# Patient Record
Sex: Male | Born: 1965 | Race: White | Hispanic: No | Marital: Married | Smoking: Never smoker
Health system: Southern US, Community
[De-identification: ages and names within clinical notes are randomized; demographics above are authoritative.]

## PROBLEM LIST (undated history)

## (undated) DIAGNOSIS — K4090 Unilateral inguinal hernia, without obstruction or gangrene, not specified as recurrent: Secondary | ICD-10-CM

## (undated) DIAGNOSIS — R079 Chest pain, unspecified: Secondary | ICD-10-CM

## (undated) DIAGNOSIS — I10 Essential (primary) hypertension: Secondary | ICD-10-CM

## (undated) HISTORY — PX: HERNIA REPAIR: SHX51

## (undated) HISTORY — DX: Chest pain, unspecified: R07.9

## (undated) HISTORY — DX: Unilateral inguinal hernia, without obstruction or gangrene, not specified as recurrent: K40.90

---
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
## 2003-09-13 ENCOUNTER — Emergency Department (HOSPITAL_COMMUNITY): Admission: EM | Admit: 2003-09-13 | Discharge: 2003-09-13 | Payer: Self-pay | Admitting: Emergency Medicine

## 2012-07-12 ENCOUNTER — Encounter (INDEPENDENT_AMBULATORY_CARE_PROVIDER_SITE_OTHER): Payer: Self-pay | Admitting: Surgery

## 2012-07-15 ENCOUNTER — Ambulatory Visit (INDEPENDENT_AMBULATORY_CARE_PROVIDER_SITE_OTHER): Payer: BC Managed Care – PPO | Admitting: Surgery

## 2012-07-17 ENCOUNTER — Encounter (INDEPENDENT_AMBULATORY_CARE_PROVIDER_SITE_OTHER): Payer: Self-pay | Admitting: Surgery

## 2012-07-30 ENCOUNTER — Ambulatory Visit (INDEPENDENT_AMBULATORY_CARE_PROVIDER_SITE_OTHER): Payer: BC Managed Care – PPO | Admitting: Surgery

## 2012-08-02 ENCOUNTER — Ambulatory Visit (INDEPENDENT_AMBULATORY_CARE_PROVIDER_SITE_OTHER): Payer: BC Managed Care – PPO | Admitting: Surgery

## 2012-08-06 ENCOUNTER — Ambulatory Visit (INDEPENDENT_AMBULATORY_CARE_PROVIDER_SITE_OTHER): Payer: BC Managed Care – PPO | Admitting: Surgery

## 2012-08-06 ENCOUNTER — Encounter (INDEPENDENT_AMBULATORY_CARE_PROVIDER_SITE_OTHER): Payer: Self-pay | Admitting: Surgery

## 2012-08-06 VITALS — BP 144/88 | HR 71 | Temp 98.8°F | Resp 16 | Ht 67.0 in | Wt 192.8 lb

## 2012-08-06 DIAGNOSIS — K4090 Unilateral inguinal hernia, without obstruction or gangrene, not specified as recurrent: Secondary | ICD-10-CM | POA: Insufficient documentation

## 2012-08-06 NOTE — Progress Notes (Signed)
Patient ID: Brian Shields, male   DOB: 28-Mar-1966, 47 y.o.   MRN: [id number]  CC;  Left inguinal hernia HPI Brian Shields is a 47 y.o. male.  Referred by Dr. Minda Ditto for evaluation of left inguinal hernia  HPI This is a healthy 47 year old male who was working out about 2 months ago when he experienced a pulling sensation in his left groin. Subsequently this has become more uncomfortable and swollen. He denies any obstructive symptoms. Feels better when he lays down. The patient remains fairly active. He was evaluated by his primary care physician who diagnosed him with a reducible left inguinal hernia. He is now referred for surgical evaluation.  Past Medical History  Diagnosis Date  . Inguinal hernia     Past Surgical History  Procedure Laterality Date  . Pyloric stinosis  1977  . Tonsillectomy  1969    Family History  Problem Relation Age of Onset  . Hypertension Father     Social History History  Substance Use Topics  . Smoking status: Never Smoker   . Smokeless tobacco: Never Used  . Alcohol Use: Yes     Comment: occasionally    Allergies  Allergen Reactions  . Doxycycline     Seizures/fainted    No meds  Review of Systems Review of Systems  Constitutional: Negative for fever, chills and unexpected weight change.  HENT: Negative for hearing loss, congestion, sore throat, trouble swallowing and voice change.   Eyes: Negative for visual disturbance.  Respiratory: Negative for cough and wheezing.   Cardiovascular: Negative for chest pain, palpitations and leg swelling.  Gastrointestinal: Positive for abdominal distention. Negative for nausea, vomiting, abdominal pain, diarrhea, constipation, blood in stool, anal bleeding and rectal pain.  Genitourinary: Negative for hematuria and difficulty urinating.  Musculoskeletal: Negative for arthralgias.  Skin: Negative for rash and wound.  Neurological: Negative for seizures, syncope, weakness and headaches.   Hematological: Negative for adenopathy. Does not bruise/bleed easily.  Psychiatric/Behavioral: Negative for confusion.    Blood pressure 144/88, pulse 71, temperature 98.8 F (37.1 C), temperature source Temporal, resp. rate 16, height 5\' 7"  (1.702 m), weight 192 lb 12.8 oz (87.454 kg).  Physical Exam Physical Exam WDWN in NAD HEENT:  EOMI, sclera anicteric Neck:  No masses, no thyromegaly Lungs:  CTA bilaterally; normal respiratory effort CV:  Regular rate and rhythm; no murmurs Abd:  +bowel sounds, soft, non-tender, no masses GU bilateral descended testes no testicular masses, visible left inguinal bulge. This enlarges with Valsalva maneuver.  This is reducible. No sign of right inguinal hernia. Ext:  Well-perfused; no edema Skin:  Warm, dry; no sign of jaundice  Data Reviewed None  Assessment    Left inguinal hernia, reducible     Plan    Left inguinal hernia repair with mesh. We discussed options for treatment and I would recommend an open mesh repair.  The surgical procedure has been discussed with the patient.  Potential risks, benefits, alternative treatments, and expected outcomes have been explained.  All of the patient's questions at this time have been answered.  The likelihood of reaching the patient's treatment goal is good.  The patient understand the proposed surgical procedure and wishes to proceed.         TSUEI,MATTHEW K. 08/06/2012, 3:50 PM

## 2012-08-20 DIAGNOSIS — K4090 Unilateral inguinal hernia, without obstruction or gangrene, not specified as recurrent: Secondary | ICD-10-CM

## 2012-09-06 ENCOUNTER — Encounter (INDEPENDENT_AMBULATORY_CARE_PROVIDER_SITE_OTHER): Payer: Self-pay | Admitting: Surgery

## 2012-09-06 ENCOUNTER — Ambulatory Visit (INDEPENDENT_AMBULATORY_CARE_PROVIDER_SITE_OTHER): Payer: BC Managed Care – PPO | Admitting: Surgery

## 2012-09-06 VITALS — BP 128/80 | HR 74 | Temp 98.0°F | Resp 14 | Ht 67.0 in | Wt 188.6 lb

## 2012-09-06 DIAGNOSIS — K4090 Unilateral inguinal hernia, without obstruction or gangrene, not specified as recurrent: Secondary | ICD-10-CM

## 2012-09-06 NOTE — Progress Notes (Signed)
Status post open repair of a large left direct inguinal hernia on 08/20/12. The patient still has some residual soreness and skin sensitivity. He is unable to wear a belt on his pants. He has some tenderness in his left testicle. Appetite and bowel movements are normal.  His incision is well-healed with no sign of infection. Minimal swelling. No sign of recurrent hernia. Testicle is nontender.  Status post open repair of a left direct inguinal hernia with mesh. The patient may slowly begin increasing his level of activity over the next couple of weeks. The skin sensitivity and tenderness should resolve. Recommend use of ibuprofen. Followup as needed.  Wilmon Arms. Corliss Skains, MD, [facility]  09/06/2012 10:02 AM

## 2016-05-09 ENCOUNTER — Telehealth: Payer: Self-pay

## 2016-05-09 NOTE — Telephone Encounter (Signed)
SEND NOTES TO SCHEDULING

## 2016-05-11 ENCOUNTER — Telehealth: Payer: Self-pay | Admitting: *Deleted

## 2016-05-11 NOTE — Telephone Encounter (Signed)
NOTES SENT TO NL ON 05/11/16

## 2016-05-12 ENCOUNTER — Telehealth: Payer: Self-pay | Admitting: Cardiology

## 2016-05-12 NOTE — Telephone Encounter (Signed)
Received records from [employer] for appointment on 06/08/16 with Dr Percival Spanish.  Records given to [facility] (medical records) for Dr Hochrein's schedule on 06/08/16. lp

## 2016-06-07 NOTE — Progress Notes (Signed)
Cardiology Office Note   Date:  06/09/2016   ID:  Brian Shields, DOB 02-Oct-1965, MRN [id number]  PCP:  London Pepper, MD  Cardiologist:   Minus Breeding, MD  Referring:  London Pepper, MD  Chief Complaint  Patient presents with  . Chest Pain      History of Present Illness: Brian Shields is a 51 y.o. male who presents for evaluation of chest discomfort starting in early November. This was left upper chest discomfort with tingling in his fifth and fourth fingers on left hand. He has some tingling down his arm. He actually went to the emergency room at [facility].  He was thought to have nonanginal chest pain. His symptoms seem to improve lasting for about a week. Occasionally he'll still get tingling in his fingers. He's not having the chest discomfort anymore. He does walk about 5000 feet a day at work. He's not particularly active however. She's not describing substernal chest discomfort, neck or arm discomfort. The discomfort that he was having was mild and a tightness. There were no associated symptoms such as nausea vomiting or diaphoresis. He does not have shortness of breath, PND or orthopnea.  Past Medical History:  Diagnosis Date  . Chest pain   . Inguinal hernia     Past Surgical History:  Procedure Laterality Date  . HERNIA REPAIR Left   . pyloric stenosis  1977  . TONSILLECTOMY  1969     No current outpatient prescriptions on file.   No current facility-administered medications for this visit.     Allergies:   Doxycycline    Social History:  The patient  reports that he has never smoked. He has never used smokeless tobacco. He reports that he drinks alcohol. He reports that he does not use drugs.   Family History:  The patient's family history includes Hypertension in his father.    ROS:  Please see the history of present illness.   Otherwise, review of systems are positive for none.   All other systems are reviewed and negative.    PHYSICAL EXAM: VS:   BP (!) 140/92 (BP Location: Left Arm, Patient Position: Sitting, Cuff Size: Normal)   Pulse 92   Ht 5\' 7"  (1.702 m)   Wt 191 lb 6 oz (86.8 kg)   BMI 29.97 kg/m  , BMI Body mass index is 29.97 kg/m. GENERAL:  Well appearing HEENT:  Pupils equal round and reactive, fundi not visualized, oral mucosa unremarkable NECK:  No jugular venous distention, waveform within normal limits, carotid upstroke brisk and symmetric, no bruits, no thyromegaly LYMPHATICS:  No cervical, inguinal adenopathy LUNGS:  Clear to auscultation bilaterally BACK:  No CVA tenderness CHEST:  Unremarkable HEART:  PMI not displaced or sustained,S1 and S2 within normal limits, no S3, no S4, no clicks, no rubs, no murmurs ABD:  Flat, positive bowel sounds normal in frequency in pitch, no bruits, no rebound, no guarding, no midline pulsatile mass, no hepatomegaly, no splenomegaly EXT:  2 plus pulses throughout, no edema, no cyanosis no clubbing SKIN:  No rashes no nodules NEURO:  Cranial nerves II through XII grossly intact, motor grossly intact throughout PSYCH:  Cognitively intact, oriented to person place and time    EKG:  EKG is ordered today. The ekg ordered today demonstrates sinus rhythm, rate 93, axis within normal limits, intervals within normal limits, no acute T-wave changes.   Recent Labs: No results found for requested labs within last 8760 hours.    Lipid Panel  No results found for: CHOL, TRIG, HDL, CHOLHDL, VLDL, LDLCALC, LDLDIRECT    Wt Readings from Last 3 Encounters:  06/08/16 191 lb 6 oz (86.8 kg)  09/06/12 188 lb 9.6 oz (85.5 kg)  08/06/12 192 lb 12.8 oz (87.5 kg)      Other studies Reviewed: Additional studies/ records that were reviewed today include: Office records Dr. Orland Mustard. Review of the above records demonstrates:  Please see elsewhere in the note.     ASSESSMENT AND PLAN:  CHEST PAIN:  The chest pain is atypical. He's interested in risk stratification. Toward that end coronary  calcium would be helpful. I'll set him up to have this. We did talk about primary risk reduction. Further evaluation will be based on this result.  HTN: His blood pressure is elevated. He says it's been borderline. He has lost about 15 pounds recently. I told him to continue with therapeutic lifestyle changes but if his blood pressure remains elevated with increased exercise, continued mild weight loss, salt restriction that he would likely need an antihypertensive.    Current medicines are reviewed at length with the patient today.  The patient does not have concerns regarding medicines.  The following changes have been made:  no change  Labs/ tests ordered today include:   Orders Placed This Encounter  Procedures  . CT CARDIAC SCORING     Disposition:   FU with me as needed.      Signed, Minus Breeding, MD  06/09/2016 9:37 AM    [facility]

## 2016-06-08 ENCOUNTER — Encounter: Payer: Self-pay | Admitting: Cardiology

## 2016-06-08 ENCOUNTER — Ambulatory Visit (INDEPENDENT_AMBULATORY_CARE_PROVIDER_SITE_OTHER): Payer: 59 | Admitting: Cardiology

## 2016-06-08 VITALS — BP 140/92 | HR 92 | Ht 67.0 in | Wt 191.4 lb

## 2016-06-08 DIAGNOSIS — R079 Chest pain, unspecified: Secondary | ICD-10-CM

## 2016-06-08 NOTE — Patient Instructions (Signed)
Medication Instructions:  Continue current medications  Labwork: None Ordered  Testing/Procedures: Your physician has requested that you have a Coronary Calcium scoring Test. This test is done at our [address] office.  Follow-Up: Your physician recommends that you schedule a follow-up appointment in: As Needed   Any Other Special Instructions Will Be Listed Below (If Applicable).   If you need a refill on your cardiac medications before your next appointment, please call your pharmacy.

## 2016-06-09 ENCOUNTER — Encounter: Payer: Self-pay | Admitting: Cardiology

## 2016-06-13 NOTE — Addendum Note (Signed)
Addended by: Vennie Homans on: 06/13/2016 12:55 PM   Modules accepted: Orders

## 2016-06-15 ENCOUNTER — Ambulatory Visit (INDEPENDENT_AMBULATORY_CARE_PROVIDER_SITE_OTHER)
Admission: RE | Admit: 2016-06-15 | Discharge: 2016-06-15 | Disposition: A | Discharge status: Home / Self Care | Payer: Self-pay | Source: Ambulatory Visit | Attending: Cardiology | Admitting: Cardiology

## 2016-06-15 DIAGNOSIS — R079 Chest pain, unspecified: Secondary | ICD-10-CM

## 2016-06-16 ENCOUNTER — Telehealth: Payer: Self-pay | Admitting: Cardiology

## 2016-06-16 NOTE — Telephone Encounter (Signed)
Returned call to patient, made aware of results and recommendations:  Notes Recorded by Minus Breeding, MD on 06/15/2016 at 4:10 PM EST Coronary calcium score is zero. No change in therapy. Call Mr. Delangel with the results and send results to London Pepper, MD   Sent to PCP per Dr. Percival Spanish request-pt verbalized understanding.

## 2016-06-16 NOTE — Telephone Encounter (Signed)
New Message    Pt stated he was returning phone call he received from nurse.

## 2016-08-09 DIAGNOSIS — Z0000 Encounter for general adult medical examination without abnormal findings: Secondary | ICD-10-CM | POA: Diagnosis not present

## 2016-08-09 DIAGNOSIS — Z23 Encounter for immunization: Secondary | ICD-10-CM | POA: Diagnosis not present

## 2016-08-09 DIAGNOSIS — E781 Pure hyperglyceridemia: Secondary | ICD-10-CM | POA: Diagnosis not present

## 2016-08-09 DIAGNOSIS — R030 Elevated blood-pressure reading, without diagnosis of hypertension: Secondary | ICD-10-CM | POA: Diagnosis not present

## 2017-04-15 DIAGNOSIS — J028 Acute pharyngitis due to other specified organisms: Secondary | ICD-10-CM | POA: Diagnosis not present

## 2017-04-15 DIAGNOSIS — B338 Other specified viral diseases: Secondary | ICD-10-CM | POA: Diagnosis not present

## 2017-04-19 DIAGNOSIS — J069 Acute upper respiratory infection, unspecified: Secondary | ICD-10-CM | POA: Diagnosis not present

## 2017-06-20 DIAGNOSIS — I10 Essential (primary) hypertension: Secondary | ICD-10-CM | POA: Diagnosis not present

## 2017-07-13 DIAGNOSIS — I10 Essential (primary) hypertension: Secondary | ICD-10-CM | POA: Diagnosis not present

## 2017-08-13 DIAGNOSIS — Z0000 Encounter for general adult medical examination without abnormal findings: Secondary | ICD-10-CM | POA: Diagnosis not present

## 2017-08-13 DIAGNOSIS — Z125 Encounter for screening for malignant neoplasm of prostate: Secondary | ICD-10-CM | POA: Diagnosis not present

## 2017-08-13 DIAGNOSIS — I10 Essential (primary) hypertension: Secondary | ICD-10-CM | POA: Diagnosis not present

## 2017-08-13 DIAGNOSIS — Z13220 Encounter for screening for lipoid disorders: Secondary | ICD-10-CM | POA: Diagnosis not present

## 2017-08-13 DIAGNOSIS — Z8349 Family history of other endocrine, nutritional and metabolic diseases: Secondary | ICD-10-CM | POA: Diagnosis not present

## 2017-11-05 DIAGNOSIS — Z1211 Encounter for screening for malignant neoplasm of colon: Secondary | ICD-10-CM | POA: Diagnosis not present

## 2017-11-05 DIAGNOSIS — D126 Benign neoplasm of colon, unspecified: Secondary | ICD-10-CM | POA: Diagnosis not present

## 2017-11-21 DIAGNOSIS — J069 Acute upper respiratory infection, unspecified: Secondary | ICD-10-CM | POA: Diagnosis not present

## 2017-12-10 DIAGNOSIS — J3089 Other allergic rhinitis: Secondary | ICD-10-CM | POA: Diagnosis not present

## 2017-12-10 DIAGNOSIS — R05 Cough: Secondary | ICD-10-CM | POA: Diagnosis not present

## 2018-08-28 DIAGNOSIS — Z0000 Encounter for general adult medical examination without abnormal findings: Secondary | ICD-10-CM | POA: Diagnosis not present

## 2018-08-28 DIAGNOSIS — Z13220 Encounter for screening for lipoid disorders: Secondary | ICD-10-CM | POA: Diagnosis not present

## 2018-08-28 DIAGNOSIS — Z8349 Family history of other endocrine, nutritional and metabolic diseases: Secondary | ICD-10-CM | POA: Diagnosis not present

## 2018-08-28 DIAGNOSIS — Z125 Encounter for screening for malignant neoplasm of prostate: Secondary | ICD-10-CM | POA: Diagnosis not present

## 2018-11-24 ENCOUNTER — Emergency Department (HOSPITAL_BASED_OUTPATIENT_CLINIC_OR_DEPARTMENT_OTHER): Payer: 59

## 2018-11-24 ENCOUNTER — Other Ambulatory Visit: Payer: Self-pay

## 2018-11-24 ENCOUNTER — Encounter (HOSPITAL_BASED_OUTPATIENT_CLINIC_OR_DEPARTMENT_OTHER): Payer: Self-pay | Admitting: Emergency Medicine

## 2018-11-24 ENCOUNTER — Emergency Department (HOSPITAL_BASED_OUTPATIENT_CLINIC_OR_DEPARTMENT_OTHER)
Admission: EM | Admit: 2018-11-24 | Discharge: 2018-11-24 | Disposition: A | Discharge status: Home / Self Care | Payer: 59 | Attending: Emergency Medicine | Admitting: Emergency Medicine

## 2018-11-24 DIAGNOSIS — R030 Elevated blood-pressure reading, without diagnosis of hypertension: Secondary | ICD-10-CM

## 2018-11-24 DIAGNOSIS — K529 Noninfective gastroenteritis and colitis, unspecified: Secondary | ICD-10-CM | POA: Diagnosis not present

## 2018-11-24 DIAGNOSIS — Z23 Encounter for immunization: Secondary | ICD-10-CM | POA: Diagnosis not present

## 2018-11-24 DIAGNOSIS — Z79899 Other long term (current) drug therapy: Secondary | ICD-10-CM | POA: Diagnosis not present

## 2018-11-24 DIAGNOSIS — I10 Essential (primary) hypertension: Secondary | ICD-10-CM | POA: Diagnosis not present

## 2018-11-24 DIAGNOSIS — S0181XA Laceration without foreign body of other part of head, initial encounter: Secondary | ICD-10-CM

## 2018-11-24 DIAGNOSIS — K921 Melena: Secondary | ICD-10-CM

## 2018-11-24 DIAGNOSIS — Y92002 Bathroom of unspecified non-institutional (private) residence single-family (private) house as the place of occurrence of the external cause: Secondary | ICD-10-CM | POA: Diagnosis not present

## 2018-11-24 DIAGNOSIS — Y999 Unspecified external cause status: Secondary | ICD-10-CM | POA: Diagnosis not present

## 2018-11-24 DIAGNOSIS — S0993XA Unspecified injury of face, initial encounter: Secondary | ICD-10-CM | POA: Diagnosis present

## 2018-11-24 DIAGNOSIS — W1812XA Fall from or off toilet with subsequent striking against object, initial encounter: Secondary | ICD-10-CM | POA: Insufficient documentation

## 2018-11-24 DIAGNOSIS — R55 Syncope and collapse: Secondary | ICD-10-CM | POA: Diagnosis not present

## 2018-11-24 DIAGNOSIS — Y9389 Activity, other specified: Secondary | ICD-10-CM | POA: Insufficient documentation

## 2018-11-24 HISTORY — DX: Essential (primary) hypertension: I10

## 2018-11-24 LAB — CBC WITH DIFFERENTIAL/PLATELET
Abs Immature Granulocytes: 0.05 10*3/uL (ref 0.00–0.07)
Basophils Absolute: 0.1 10*3/uL (ref 0.0–0.1)
Basophils Relative: 1 %
Eosinophils Absolute: 0.2 10*3/uL (ref 0.0–0.5)
Eosinophils Relative: 2 %
HCT: 51.4 % (ref 39.0–52.0)
Hemoglobin: 17 g/dL (ref 13.0–17.0)
Immature Granulocytes: 0 %
Lymphocytes Relative: 16 %
Lymphs Abs: 2 10*3/uL (ref 0.7–4.0)
MCH: 29.6 pg (ref 26.0–34.0)
MCHC: 33.1 g/dL (ref 30.0–36.0)
MCV: 89.5 fL (ref 80.0–100.0)
Monocytes Absolute: 1.2 10*3/uL — ABNORMAL HIGH (ref 0.1–1.0)
Monocytes Relative: 10 %
Neutro Abs: 8.9 10*3/uL — ABNORMAL HIGH (ref 1.7–7.7)
Neutrophils Relative %: 71 %
Platelets: 257 10*3/uL (ref 150–400)
RBC: 5.74 MIL/uL (ref 4.22–5.81)
RDW: 11.9 % (ref 11.5–15.5)
WBC: 12.4 10*3/uL — ABNORMAL HIGH (ref 4.0–10.5)
nRBC: 0 % (ref 0.0–0.2)

## 2018-11-24 LAB — CBC
HCT: 48 % (ref 39.0–52.0)
Hemoglobin: 16 g/dL (ref 13.0–17.0)
MCH: 29.6 pg (ref 26.0–34.0)
MCHC: 33.3 g/dL (ref 30.0–36.0)
MCV: 88.7 fL (ref 80.0–100.0)
Platelets: 241 10*3/uL (ref 150–400)
RBC: 5.41 MIL/uL (ref 4.22–5.81)
RDW: 12.1 % (ref 11.5–15.5)
WBC: 14 10*3/uL — ABNORMAL HIGH (ref 4.0–10.5)
nRBC: 0 % (ref 0.0–0.2)

## 2018-11-24 LAB — COMPREHENSIVE METABOLIC PANEL
ALT: 31 U/L (ref 0–44)
AST: 25 U/L (ref 15–41)
Albumin: 4.5 g/dL (ref 3.5–5.0)
Alkaline Phosphatase: 45 U/L (ref 38–126)
Anion gap: 9 (ref 5–15)
BUN: 15 mg/dL (ref 6–20)
CO2: 26 mmol/L (ref 22–32)
Calcium: 10.3 mg/dL (ref 8.9–10.3)
Chloride: 103 mmol/L (ref 98–111)
Creatinine, Ser: 1.05 mg/dL (ref 0.61–1.24)
GFR calc Af Amer: >60 mL/min (ref >60)
GFR calc non Af Amer: >60 mL/min (ref >60)
Glucose, Bld: 121 mg/dL — ABNORMAL HIGH (ref 70–99)
Potassium: 4 mmol/L (ref 3.5–5.1)
Sodium: 138 mmol/L (ref 135–145)
Total Bilirubin: 1 mg/dL (ref 0.3–1.2)
Total Protein: 7.9 g/dL (ref 6.5–8.1)

## 2018-11-24 LAB — TROPONIN I: Troponin I: <0.03 ng/mL (ref <0.03)

## 2018-11-24 LAB — OCCULT BLOOD X 1 CARD TO LAB, STOOL: Fecal Occult Bld: POSITIVE — AB

## 2018-11-24 LAB — CBG MONITORING, ED: Glucose-Capillary: 107 mg/dL — ABNORMAL HIGH (ref 70–99)

## 2018-11-24 MED ORDER — DICYCLOMINE HCL 10 MG PO CAPS
20.0000 mg | ORAL_CAPSULE | Freq: Once | ORAL | Status: AC
  Administered 2018-11-24: 20 mg via ORAL
  Filled 2018-11-24: qty 2

## 2018-11-24 MED ORDER — IOHEXOL 300 MG/ML  SOLN
100.0000 mL | Freq: Once | INTRAMUSCULAR | Status: AC | PRN
  Administered 2018-11-24: 100 mL via INTRAVENOUS

## 2018-11-24 MED ORDER — TETANUS-DIPHTH-ACELL PERTUSSIS 5-2.5-18.5 LF-MCG/0.5 IM SUSP
0.5000 mL | Freq: Once | INTRAMUSCULAR | Status: AC
  Administered 2018-11-24: 0.5 mL via INTRAMUSCULAR
  Filled 2018-11-24: qty 0.5

## 2018-11-24 MED ORDER — SODIUM CHLORIDE 0.9 % IV BOLUS
1000.0000 mL | Freq: Once | INTRAVENOUS | Status: AC
  Administered 2018-11-24: 1000 mL via INTRAVENOUS

## 2018-11-24 MED ORDER — LIDOCAINE-EPINEPHRINE (PF) 2 %-1:200000 IJ SOLN
10.0000 mL | Freq: Once | INTRAMUSCULAR | Status: AC
  Administered 2018-11-24: 10 mL
  Filled 2018-11-24 (×2): qty 10

## 2018-11-24 MED ORDER — DICYCLOMINE HCL 20 MG PO TABS: 20.0000 mg | ORAL_TABLET | Freq: Two times a day (BID) | ORAL | 0 refills | Status: DC

## 2018-11-24 NOTE — ED Notes (Signed)
Patient transported to CT 

## 2018-11-24 NOTE — Discharge Instructions (Addendum)
Please read and follow all provided instructions.  Your diagnoses today include:  1. Enterocolitis   2. Syncope, unspecified syncope type   3. Blood in stool   4. Laceration of forehead, initial encounter     Tests performed today include: Vital signs. See below for your results today.   Your CT scan showed no fractures in the face, skull, or evidence of bleeding in the brain.  Please review the CT report I gave you.  It showed evidence of enterocolitis, unclear infectious versus inflammatory etiology.  Your hemoglobin (oxygen capacity in the blood) was stable on 2 evaluations today.  Medications prescribed:   Take any prescribed medications only as directed.   Please take Bentyl twice daily as needed for abdominal cramps. This is an antispasmodic agent.   Home care instructions:  Follow any educational materials and wound care instructions contained in this packet.   Keep affected area above the level of your heart when possible to minimize swelling. Wash area gently twice a day with warm soapy water. Do not apply alcohol or hydrogen peroxide. Cover the area if it draining or weeping.   Please ice the swelling over your left eyebrow diligently.  This will help to decrease the amount of swelling.  Please ensure to place a towel in between your ice and the skin.  Follow-up instructions: Suture Removal: Return to see your primary care care doctor in 5 days for a recheck of your wound and removal of your sutures or staples.    Return instructions:  Return to the Emergency Department if you have: Fever Worsening pain Worsening swelling of the wound Pus draining from the wound Redness of the skin that moves away from the wound, especially if it streaks away from the affected area  Any other emergent concerns  Return to the emergency department if you have any dizziness, lightheadedness, further passing out spells, particularly after passing any bright red blood per  rectum.  Your vital signs today were: BP (!) 151/101    Pulse 90    Temp 97.8 F (36.6 C) (Oral)    Resp 20    Ht 5\' 7"  (1.702 m)    Wt 88.5 kg    SpO2 100%    BMI 30.54 kg/m  If your blood pressure (BP) was elevated above 135/85 this visit, please have this repeated by your doctor within one month. --------------

## 2018-11-24 NOTE — ED Notes (Addendum)
Pt returned from CT scan. CT tech reports he vomited after receiving IV contrast. Pt states nausea is relieved at this time. Provider notified

## 2018-11-24 NOTE — ED Notes (Signed)
Pt ambulatory to d/c window with steady gait. Pt verbalized understanding of f/u care and that RX can be picked up at pharmacy listed on AVS. Pt walked to car without difficulty with stand by assist from ED staff

## 2018-11-24 NOTE — ED Notes (Signed)
ED Provider at bedside. 

## 2018-11-24 NOTE — ED Provider Notes (Signed)
[facility] Provider Note   CSN: [id number] Arrival date & time: 11/24/18  1639     History   Chief Complaint Chief Complaint  Patient presents with  . Loss of Consciousness    HPI Brian Shields is a 53 y.o. male.     HPI  Patient is a 53 year old male with past medical history of hypertension presenting for episode of syncope while on the toilet today.  Patient reports that he has had a diarrhea illness today and just prior to arrival he was having some loose stools when he found himself on the ground.  Patient reports that he was clammy at the time.  Patient did not know if he was straining or not.  He denies any prodromal chest pain or shortness of breath.  He denies any headache before the syncope.  Denies numbness or tingling. He developed a hematoma on the forehead as a result of the syncope.  He reports that he feels back to baseline now.  He does report that when he had another bowel movement after the episode of syncope he noticed bright red blood in the toilet.  He denies a history of this.  Patient's other episode of syncope 1 year ago occurred in the exact same fashion with abdominal cramping followed by diarrhea.  He was not medically evaluated time.  Patient had a cardiac evaluation 2 years ago after an episode of chest pain and he was cleared by his cardiologist after a unremarkable coronary artery CT and cardiovascular work-up.  Past Medical History:  Diagnosis Date  . Chest pain   . Hypertension   . Inguinal hernia     Patient Active Problem List   Diagnosis Date Noted  . Left inguinal hernia 08/06/2012    Past Surgical History:  Procedure Laterality Date  . HERNIA REPAIR Left   . pyloric stenosis  1977  . TONSILLECTOMY  1969        Home Medications    Prior to Admission medications   Medication Sig Start Date End Date Taking? Authorizing Provider  lisinopril (ZESTRIL) 10 MG tablet Take 10 mg by mouth daily.   Yes  [provider]    Family History Family History  Problem Relation Age of Onset  . Hypertension Father     Social History Social History   Tobacco Use  . Smoking status: Never Smoker  . Smokeless tobacco: Never Used  Substance Use Topics  . Alcohol use: Yes    Comment: occasionally  . Drug use: No     Allergies   Doxycycline   Review of Systems Review of Systems  Constitutional: Negative for chills and fever.  HENT: Negative for congestion, rhinorrhea, sinus pain and sore throat.   Eyes: Negative for visual disturbance.  Respiratory: Negative for cough, chest tightness and shortness of breath.   Cardiovascular: Negative for chest pain, palpitations and leg swelling.  Gastrointestinal: Positive for abdominal pain and diarrhea. Negative for constipation, nausea and vomiting.  Genitourinary: Negative for dysuria and flank pain.  Musculoskeletal: Negative for back pain and myalgias.  Skin: Positive for wound. Negative for rash.  Neurological: Positive for syncope. Negative for dizziness, light-headedness and headaches.     Physical Exam Updated Vital Signs BP (!) 143/95   Pulse 80   Resp 13   Ht 5\' 7"  (1.702 m)   Wt 88.5 kg   SpO2 97%   BMI 30.54 kg/m   Physical Exam Vitals signs and nursing note reviewed.  Constitutional:  General: He is not in acute distress.    Appearance: He is well-developed.  HENT:     Head: Normocephalic and atraumatic.     Mouth/Throat:     Mouth: Mucous membranes are moist.  Eyes:     Conjunctiva/sclera: Conjunctivae normal.     Pupils: Pupils are equal, round, and reactive to light.  Neck:     Musculoskeletal: Normal range of motion and neck supple.  Cardiovascular:     Rate and Rhythm: Normal rate and regular rhythm.     Heart sounds: S1 normal and S2 normal. No murmur.  Pulmonary:     Effort: Pulmonary effort is normal.     Breath sounds: Normal breath sounds. No wheezing or rales.  Abdominal:     General:  There is no distension.     Palpations: Abdomen is soft.     Tenderness: There is abdominal tenderness. There is no guarding.     Comments: Mild diffuse periumbilical and lower abdominal tenderness without guarding or rebound.   Musculoskeletal: Normal range of motion.        General: No deformity.  Lymphadenopathy:     Cervical: No cervical adenopathy.  Skin:    General: Skin is warm and dry.     Findings: No erythema or rash.  Neurological:     Mental Status: He is alert.     Comments: Cranial nerves grossly intact. Patient moves extremities symmetrically and with good coordination.  Psychiatric:        Behavior: Behavior normal.        Thought Content: Thought content normal.        Judgment: Judgment normal.      ED Treatments / Results  Labs (all labs ordered are listed, but only abnormal results are displayed) Labs Reviewed  COMPREHENSIVE METABOLIC PANEL - Abnormal; Notable for the following components:      Result Value   Glucose, Bld 121 (*)    All other components within normal limits  CBC WITH DIFFERENTIAL/PLATELET - Abnormal; Notable for the following components:   WBC 12.4 (*)    Neutro Abs 8.9 (*)    Monocytes Absolute 1.2 (*)    All other components within normal limits  CBC - Abnormal; Notable for the following components:   WBC 14.0 (*)    All other components within normal limits  OCCULT BLOOD X 1 CARD TO LAB, STOOL - Abnormal; Notable for the following components:   Fecal Occult Bld POSITIVE (*)    All other components within normal limits  CBG MONITORING, ED - Abnormal; Notable for the following components:   Glucose-Capillary 107 (*)    All other components within normal limits  GASTROINTESTINAL PANEL BY PCR, STOOL (REPLACES STOOL CULTURE)  TROPONIN I  POC OCCULT BLOOD, ED    EKG EKG Interpretation  Date/Time:  Sunday November 24 2018 16:47:50 EDT Ventricular Rate:  79 PR Interval:    QRS Duration: 90 QT Interval:  375 QTC Calculation: 430 R  Axis:   43 Text Interpretation:  Sinus rhythm Abnormal R-wave progression, early transition Borderline T wave abnormalities No STEMI  Confirmed by Nanda Quinton [phone]) on 11/24/2018 4:51:56 PM   Radiology Ct Head Wo Contrast  Result Date: 11/24/2018 CLINICAL DATA:  Syncopal episode hit face on shower and floor with left facial swelling and bruising EXAM: CT HEAD WITHOUT CONTRAST CT MAXILLOFACIAL WITHOUT CONTRAST TECHNIQUE: Multidetector CT imaging of the head and maxillofacial structures were performed using the standard protocol without intravenous contrast. Multiplanar CT  image reconstructions of the maxillofacial structures were also generated. COMPARISON:  None. FINDINGS: CT HEAD FINDINGS Brain: No evidence of acute infarction, hemorrhage, hydrocephalus, extra-axial collection or mass lesion/mass effect. Vascular: No hyperdense vessel or unexpected calcification. Skull: Normal. Negative for fracture or focal lesion. Other: Large left forehead and periorbital soft tissue swelling CT MAXILLOFACIAL FINDINGS Osseous: Mandibular heads are normally position. No mandibular fracture. Pterygoid plates and zygomatic arches are intact. No acute nasal bone fracture. Orbits: Negative. No traumatic or inflammatory finding. Sinuses: No acute fluid level. No sinus wall fracture. Patchy mucosal thickening in the left ethmoid and frontal sinus. Hypoplastic left maxillary sinus which is opacified. Soft tissues: Moderate to large left periorbital and forehead hematoma IMPRESSION: 1. Negative non contrasted CT appearance of the brain. 2. No acute facial bone fracture 3. Moderate to large left forehead and periorbital soft tissue hematoma Electronically Signed   By: Donavan Foil M.D.   On: 11/24/2018 18:47   Ct Abdomen Pelvis W Contrast  Result Date: 11/24/2018 CLINICAL DATA:  Abdominal pain EXAM: CT ABDOMEN AND PELVIS WITH CONTRAST TECHNIQUE: Multidetector CT imaging of the abdomen and pelvis was performed using the  standard protocol following bolus administration of intravenous contrast. CONTRAST:  170mL OMNIPAQUE IOHEXOL 300 MG/ML  SOLN COMPARISON:  None. FINDINGS: Lower chest: Lung bases demonstrate no acute consolidation or effusion. Borderline cardiomegaly. Small hiatal hernia Hepatobiliary: No focal liver abnormality is seen. No gallstones, gallbladder wall thickening, or biliary dilatation. Pancreas: Unremarkable. No pancreatic ductal dilatation or surrounding inflammatory changes. Spleen: Normal in size without focal abnormality. Adrenals/Urinary Tract: Adrenal glands are unremarkable. Kidneys are normal, without renal calculi, focal lesion, or hydronephrosis. Bladder is unremarkable. Stomach/Bowel: The stomach is nonenlarged. Possible thickening of left upper quadrant jejunal bowel loops. Possible mild wall thickening of the distal descending and sigmoid colon. Not much inflammatory change in the surrounding colon fat. Negative appendix Vascular/Lymphatic: No significant vascular findings are present. No enlarged abdominal or pelvic lymph nodes. Reproductive: Prostate is unremarkable. Other: Negative for free air or free fluid. Small fat in the left inguinal canal Musculoskeletal: No acute or significant osseous findings. IMPRESSION: 1. Possible mild wall thickening of left upper quadrant jejunal small bowel loops in addition to equivocal wall thickening of the descending and sigmoid colon. Findings are questionable for enterocolitis of infectious or inflammatory etiology. 2. Otherwise no CT evidence for acute intra-abdominal or pelvic abnormality. Electronically Signed   By: Donavan Foil M.D.   On: 11/24/2018 19:36   Ct Maxillofacial Wo Cm  Result Date: 11/24/2018 CLINICAL DATA:  Syncopal episode hit face on shower and floor with left facial swelling and bruising EXAM: CT HEAD WITHOUT CONTRAST CT MAXILLOFACIAL WITHOUT CONTRAST TECHNIQUE: Multidetector CT imaging of the head and maxillofacial structures were  performed using the standard protocol without intravenous contrast. Multiplanar CT image reconstructions of the maxillofacial structures were also generated. COMPARISON:  None. FINDINGS: CT HEAD FINDINGS Brain: No evidence of acute infarction, hemorrhage, hydrocephalus, extra-axial collection or mass lesion/mass effect. Vascular: No hyperdense vessel or unexpected calcification. Skull: Normal. Negative for fracture or focal lesion. Other: Large left forehead and periorbital soft tissue swelling CT MAXILLOFACIAL FINDINGS Osseous: Mandibular heads are normally position. No mandibular fracture. Pterygoid plates and zygomatic arches are intact. No acute nasal bone fracture. Orbits: Negative. No traumatic or inflammatory finding. Sinuses: No acute fluid level. No sinus wall fracture. Patchy mucosal thickening in the left ethmoid and frontal sinus. Hypoplastic left maxillary sinus which is opacified. Soft tissues: Moderate to large left periorbital and  forehead hematoma IMPRESSION: 1. Negative non contrasted CT appearance of the brain. 2. No acute facial bone fracture 3. Moderate to large left forehead and periorbital soft tissue hematoma Electronically Signed   By: Donavan Foil M.D.   On: 11/24/2018 18:47    Procedures .Marland KitchenLaceration Repair  Date/Time: 11/24/2018 9:41 PM Performed by: Albesa Seen, PA-C Authorized by: Albesa Seen, PA-C   Consent:    Consent obtained:  Verbal   Consent given by:  Patient   Risks discussed:  Infection and pain Anesthesia (see MAR for exact dosages):    Anesthesia method:  Local infiltration   Local anesthetic:  Lidocaine 2% WITH epi Laceration details:    Location:  Face   Face location:  Forehead   Length (cm):  6 Repair type:    Repair type:  Simple Pre-procedure details:    Preparation:  Patient was prepped and draped in usual sterile fashion Exploration:    Hemostasis achieved with:  Direct pressure   Wound exploration: wound explored through full  range of motion     Contaminated: no   Treatment:    Area cleansed with:  Betadine and saline   Amount of cleaning:  Standard Skin repair:    Repair method:  Sutures   Suture size:  6-0   Suture material:  Prolene   Suture technique:  Simple interrupted   Number of sutures:  8 Approximation:    Approximation:  Close Post-procedure details:    Dressing:  Antibiotic ointment and non-adherent dressing   Patient tolerance of procedure:  Tolerated well, no immediate complications   (including critical care time)  Medications Ordered in ED Medications  iohexol (OMNIPAQUE) 300 MG/ML solution 100 mL (has no administration in time range)  sodium chloride 0.9 % bolus 1,000 mL (1,000 mLs Intravenous New Bag/Given 11/24/18 1749)  Tdap (BOOSTRIX) injection 0.5 mL (0.5 mLs Intramuscular Given 11/24/18 1746)  lidocaine-EPINEPHrine (XYLOCAINE W/EPI) 2 %-1:200000 (PF) injection 10 mL (10 mLs Infiltration Given 11/24/18 1747)     Initial Impression / Assessment and Plan / ED Course  I have reviewed the triage vital signs and the nursing notes.  Pertinent labs & imaging results that were available during my care of the patient were reviewed by me and considered in my medical decision making (see chart for details).  Clinical Course as of Nov 24 2138  Sun Nov 24, 2018  2011 Patient reports feeling better.    [AM]  2058 Fecal Occult Blood, POC(!): POSITIVE [AM]    Clinical Course User Index [AM] Albesa Seen, PA-C       DDx includes: Orthostatic hypotension Vasovagal syncope Stroke Vertebral artery dissection/stenosis Dysrhythmia PE Vasovagal/neurocardiogenic syncope Aortic stenosis Valvular disorder/Cardiomyopathy Anemia  Patient is nontoxic-appearing, afebrile, hemodynamically stable and neurologically intact.  History most consistent with vasovagal syncope.  He does have some blood in the toilet raising concern for possible diverticular bleed or GI bleeding.  He does not take  blood thinners.  Work-up demonstrating slight elevation WBC count to 12.4.  He has a hemoglobin of 17.  CMP unremarkable.  Troponin negative.  EKG in normal sinus rhythm without evidence of arrhythmia, infarction, or ischemia, reviewed by me.  Patient CBC was repeated at 3 hours given patient's report of ongoing bloody stools.  It went from 17-16.  CT head and CT maxillofacial without any fracture or intracranial abnormality.  Patient CT abdomen and pelvis demonstrating some areas of some inflammation in the jejunum as well as sigmoid colon.  No  diverticulitis.  Appears to be enterocolitis either infectious or inflammatory in etiology.  Patient had a bloody stool here in the emergency department, and a GI panel was sent.  Discussed with the patient that we will not initiate antibiotics without the GI panel.  Should this result with a non-Shiga toxin bacteria in the stool, would recommend likely Cipro.  Patient is also given Bentyl for his cramping.   Patient is followed by [facility] gastroenterology.  He has previously had a colonoscopy with them.  Patient was instructed to call and make an appointment with them. Message sent in EMR to on call [place].  He is also instructed to follow-up with his primary care provider for recheck later in the week in addition to removal of sutures.  Patient was counseled on proper suture care.  He is instructed to return for any dizziness, lightheadedness, sensation of syncope, large volumes of blood per rectum, or any new or worsening symptoms.  Patient is in understanding and agrees with the plan of care.  This is a supervised visit with Dr. Nanda Quinton. Evaluation, management, and discharge planning discussed with this attending physician.  Final Clinical Impressions(s) / ED Diagnoses   Final diagnoses:  Enterocolitis  Syncope, unspecified syncope type  Blood in stool  Laceration of forehead, initial encounter  Elevated blood pressure reading    ED  Discharge Orders    None       Tamala Julian 11/24/18 2152    Margette Fast, MD 11/25/18 [phone]

## 2018-11-24 NOTE — ED Triage Notes (Signed)
Sitting on commode x 90 min ago and "passed out" Wife heard him hit the floor, hematoma to left forehead, about 3-4 inch cut noted as well. Abrasions to left side of face. States had a prior episode of same last year but did not seek med care at that time . GCS 15, at present.

## 2018-11-25 LAB — GASTROINTESTINAL PANEL BY PCR, STOOL (REPLACES STOOL CULTURE)

## 2019-01-28 ENCOUNTER — Other Ambulatory Visit: Payer: Self-pay | Admitting: Gastroenterology

## 2019-01-28 DIAGNOSIS — R9389 Abnormal findings on diagnostic imaging of other specified body structures: Secondary | ICD-10-CM

## 2019-02-04 ENCOUNTER — Other Ambulatory Visit: Payer: Self-pay

## 2019-02-04 ENCOUNTER — Ambulatory Visit
Admission: RE | Admit: 2019-02-04 | Discharge: 2019-02-04 | Disposition: A | Discharge status: Home / Self Care | Payer: 59 | Source: Ambulatory Visit | Attending: Gastroenterology | Admitting: Gastroenterology

## 2019-02-04 DIAGNOSIS — R9389 Abnormal findings on diagnostic imaging of other specified body structures: Secondary | ICD-10-CM

## 2019-02-04 MED ORDER — IOPAMIDOL (ISOVUE-300) INJECTION 61%
125.0000 mL | Freq: Once | INTRAVENOUS | Status: AC | PRN
  Administered 2019-02-04: 11:00:00 125 mL via INTRAVENOUS

## 2021-01-11 IMAGING — CT CT ENTEROGRAPHY (ABD-PELV W/ CM)
1 of 4 series · 12 of 32 positions shown, 18 images · IV contrast (iopamidol)
Comparison: 11/24/2018

CLINICAL DATA: Possible bowel wall thickening on previous CT.

EXAM:
CT ABDOMEN AND PELVIS WITH CONTRAST (ENTEROGRAPHY)
TECHNIQUE: Multidetector CT of the abdomen and pelvis during bolus
administration of intravenous contrast. Negative oral contrast was
given.
CONTRAST:  125mL [id number] IOPAMIDOL (UDMEGV-A77) INJECTION 61%

[Series 3: enterography 3 mm · axial · 0.86mm/px · z∈[-470,-74]mm · 12 of 157 slices shown, 18 images]
[im 13/157  soft-tissue]
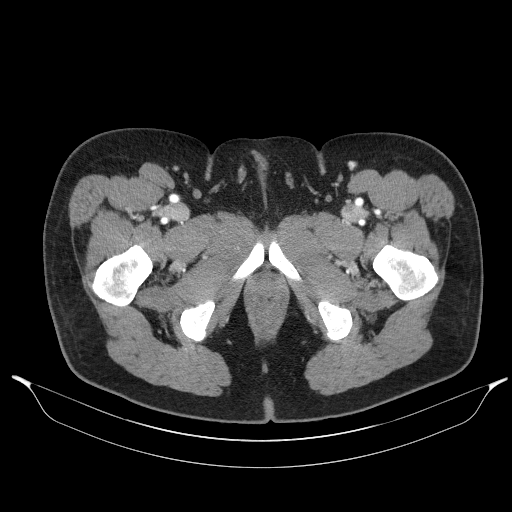
[im 13/157  bone]
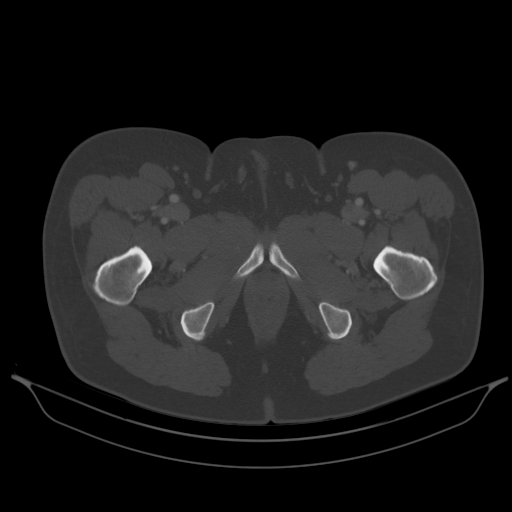
[im 25/157  soft-tissue]
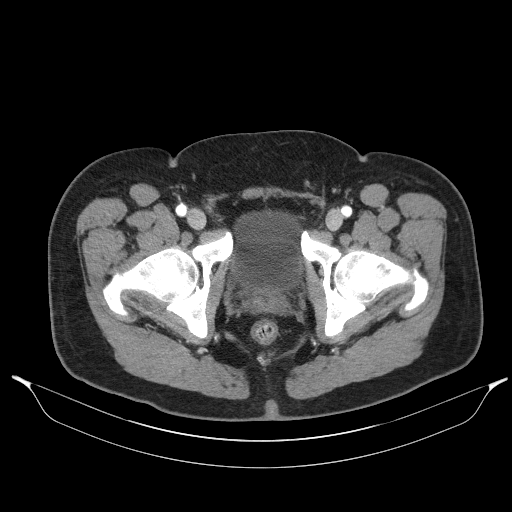
[im 37/157  soft-tissue]
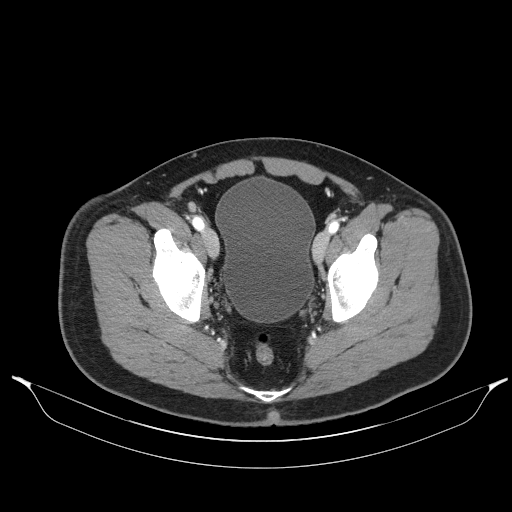
[im 49/157  soft-tissue]
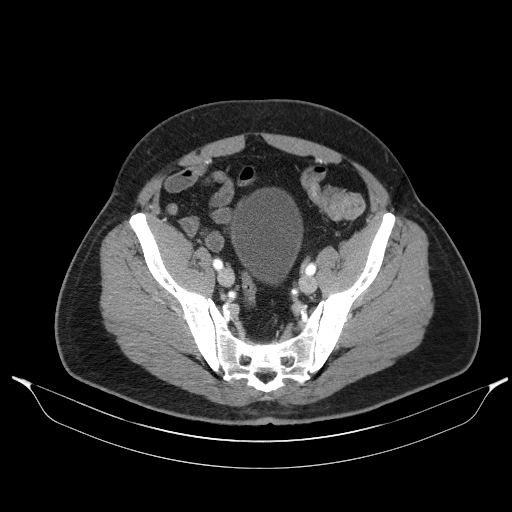
[im 61/157  soft-tissue]
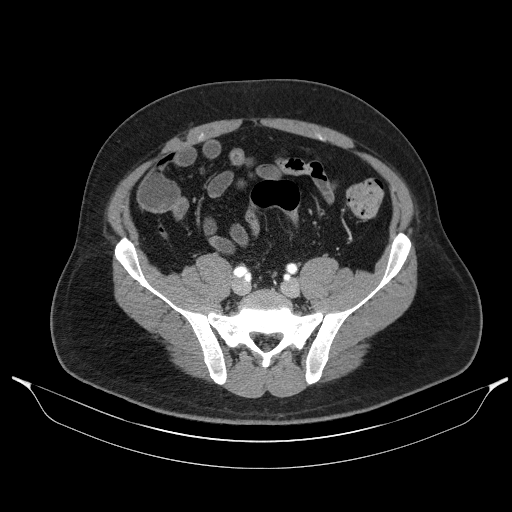
[im 73/157  soft-tissue]
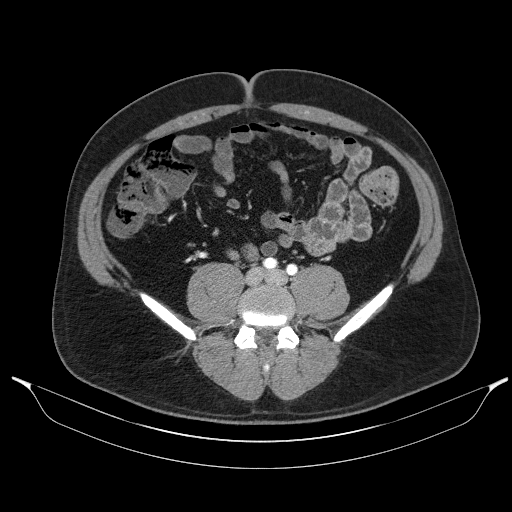
[im 85/157  soft-tissue]
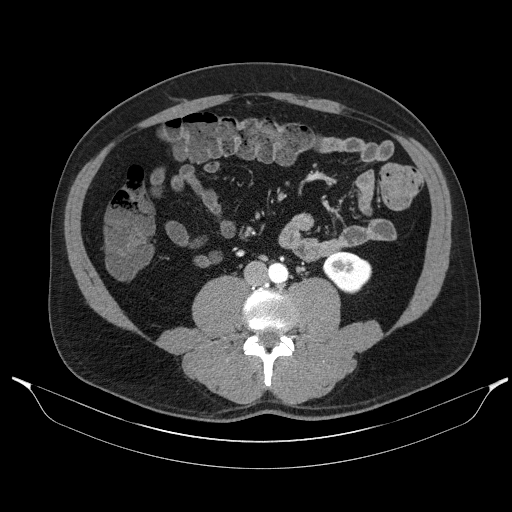
[im 97/157  soft-tissue]
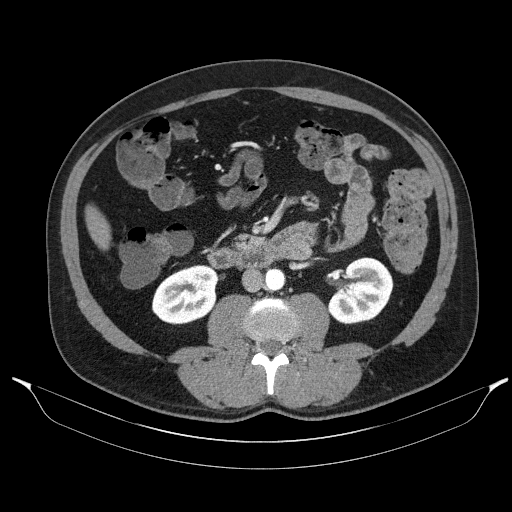
[im 109/157  soft-tissue]
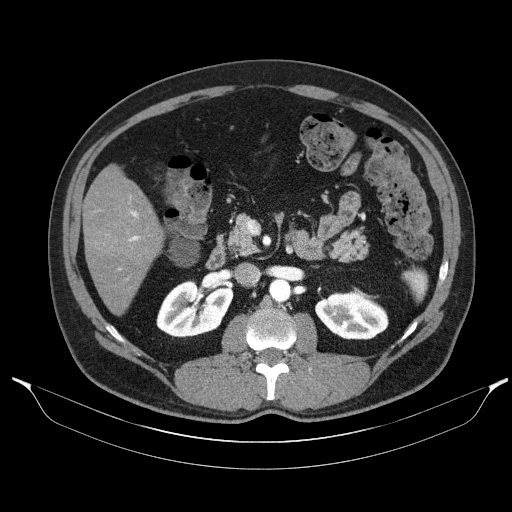
[im 109/157  lung]
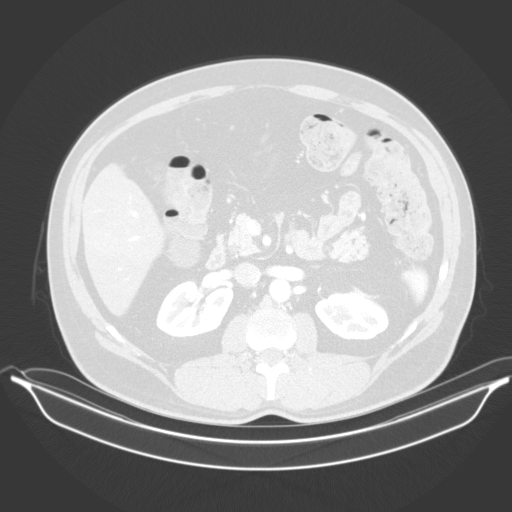
[im 109/157  bone]
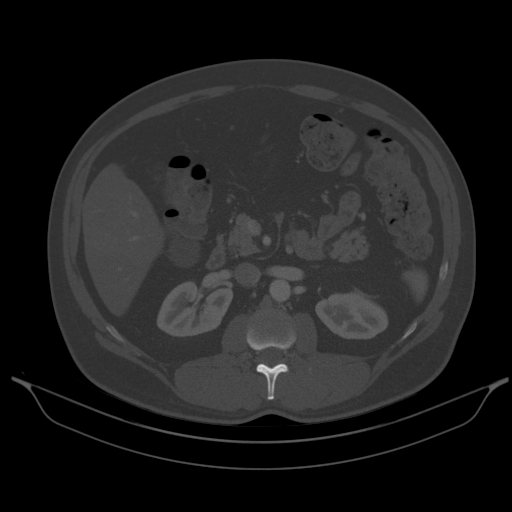
[im 121/157  soft-tissue]
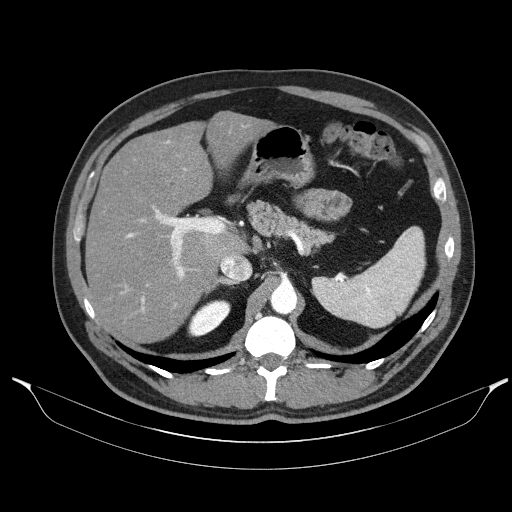
[im 121/157  lung]
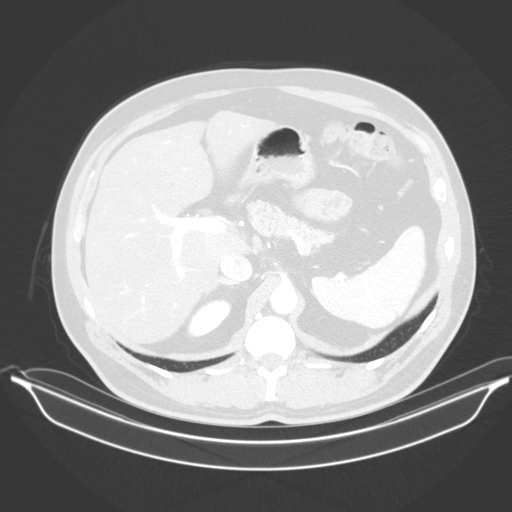
[im 133/157  soft-tissue]
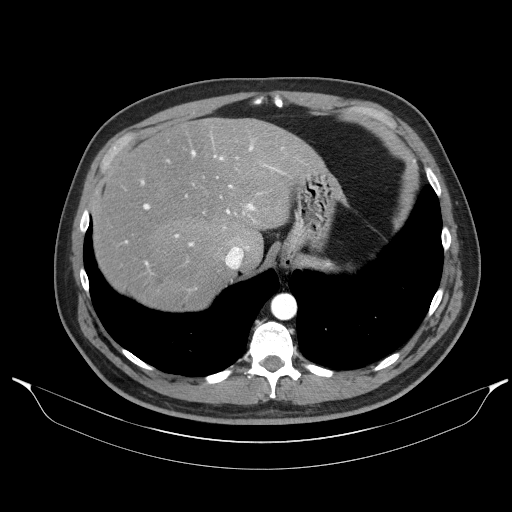
[im 133/157  lung]
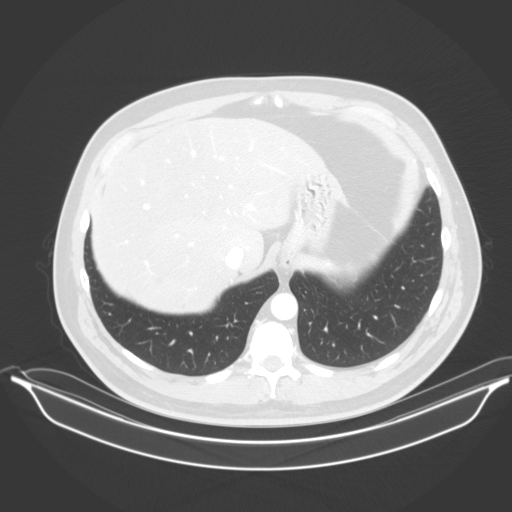
[im 145/157  soft-tissue]
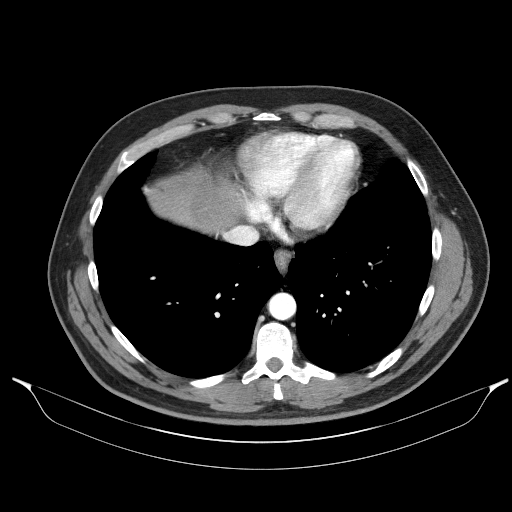
[im 145/157  lung]
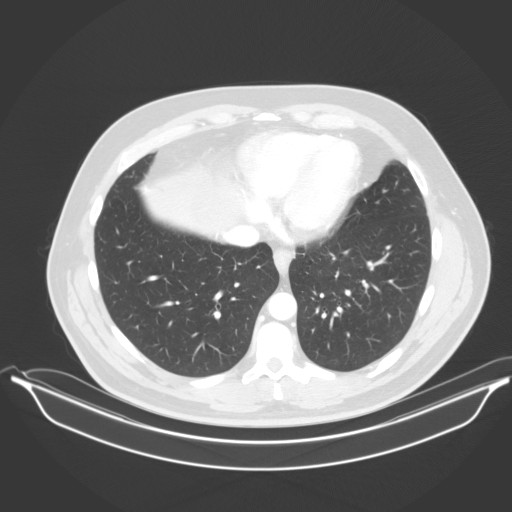

[12 of 32 positions shown; findings below may reference images not displayed]

FINDINGS: Lower chest:  Unremarkable.

Hepatobiliary: The liver shows diffusely decreased attenuation
suggesting fat deposition. There is no evidence for gallstones,
gallbladder wall thickening, or pericholecystic fluid. No
intrahepatic or extrahepatic biliary dilation.

Pancreas: No focal mass lesion. No dilatation of the main duct. No
intraparenchymal cyst. No peripancreatic edema.

Spleen: No splenomegaly. No focal mass lesion.

Adrenals/Urinary Tract: No adrenal nodule or mass. Kidneys
unremarkable. No evidence for hydroureter. The urinary bladder
appears normal for the degree of distention.

Stomach/Bowel: Tiny hiatal hernia. Stomach otherwise unremarkable.
No abnormal gastric wall thickening or enhancement. Duodenum is
normally positioned as is the ligament of Treitz. Jejunal loops show
normal fold thickness. No abnormal mural enhancement. Ileal loops
are normal. No evidence for inflammatory or fibro stenotic
stricture. The terminal ileum is normal. The appendix is normal. No
gross colonic mass. No colonic wall thickening.

Vascular/Lymphatic: No abdominal aortic aneurysm. No abdominal
aortic atherosclerotic calcification. There is no gastrohepatic or
hepatoduodenal ligament lymphadenopathy. No intraperitoneal or
retroperitoneal lymphadenopathy. No pelvic sidewall lymphadenopathy.

Reproductive: The prostate gland and seminal vesicles are
unremarkable.

Other: No intraperitoneal free fluid.

Musculoskeletal: No worrisome lytic or sclerotic osseous
abnormality.
IMPRESSION: 1. No abnormal wall thickening or abnormal mural enhancement in the
stomach, small bowel, or colon. No evidence for inflammatory or
fibro stenotic stricture.
2. Hepatic steatosis.
3. Tiny hiatal hernia.

## 2021-02-17 ENCOUNTER — Ambulatory Visit
Admission: RE | Admit: 2021-02-17 | Discharge: 2021-02-17 | Disposition: A | Discharge status: Home / Self Care | Payer: 59 | Source: Ambulatory Visit | Attending: Family Medicine | Admitting: Family Medicine

## 2021-02-17 ENCOUNTER — Other Ambulatory Visit: Payer: Self-pay | Admitting: Family Medicine

## 2021-02-17 ENCOUNTER — Other Ambulatory Visit: Payer: Self-pay

## 2021-02-17 DIAGNOSIS — M549 Dorsalgia, unspecified: Secondary | ICD-10-CM

## 2021-02-24 ENCOUNTER — Encounter: Payer: Self-pay | Admitting: Neurology

## 2021-03-18 ENCOUNTER — Ambulatory Visit: Payer: 59 | Admitting: Neurology

## 2021-03-24 ENCOUNTER — Ambulatory Visit: Payer: 59 | Admitting: Neurology

## 2021-04-12 ENCOUNTER — Ambulatory Visit (INDEPENDENT_AMBULATORY_CARE_PROVIDER_SITE_OTHER): Payer: 59 | Admitting: Neurology

## 2021-04-12 ENCOUNTER — Other Ambulatory Visit: Payer: Self-pay

## 2021-04-12 ENCOUNTER — Encounter: Payer: Self-pay | Admitting: Neurology

## 2021-04-12 VITALS — BP 135/93 | HR 105 | Ht 67.0 in | Wt 189.0 lb

## 2021-04-12 DIAGNOSIS — R55 Syncope and collapse: Secondary | ICD-10-CM

## 2021-04-12 NOTE — Patient Instructions (Signed)
Good to meet you! Try the counterpressure maneuvers when you start feeling any symptoms. Also, it is important to keep hydrated (8 glasses of water a day). Follow-up as needed, call for any changes

## 2021-04-12 NOTE — Progress Notes (Signed)
NEUROLOGY CONSULTATION NOTE  Brian Shields MRN: [id number] DOB: [birth year]  Referring provider: Dr. London Shields Primary care provider: Dr. London Shields  Reason for consult:  syncope  Dear Brian Shields:  Thank you for your kind referral of Brian Shields for consultation of the above symptoms. Although his history is well known to you, please allow me to reiterate it for the purpose of our medical record. He is alone in the office today. Records and images were personally reviewed where available.   HISTORY OF PRESENT ILLNESS: This is a very pleasant 55 year old right-handed man with a history of hypertension presenting for evaluation of recurrent syncope. He has had 3 episodes, in 2019, 2020, and most recently August 2022. They have all occurred while he is on the commode. ER notes were reviewed. With the first and second episodes, he reported abdominal cramping followed by diarrhea, then waking up on the ground. His wife had heard both falls and found him on the ground with no convulsive activity reported. He went to the ER after the second episode in 11/2018, he had a head CT which I personally reviewed, no acute intracranial changes. There was large left forehead and periorbital soft tissue swelling. EKG reported sinus rhythm, abnormal R-wave progression, early transition borderline T wave abnormalities. He had previously been evaluated by Cardiology for chest pain (not syncope) and had a normal coronary CT cardiac scoring in 2018. With the most recently episode last 01/2021, he started having really bad stomach cramps but could not relieve himself but was not pushing hard/straining. He then started to get really hot, his face started sweating. He stood up because he felt this way, then woke up on the floor. He noted some saliva around his face. No focal weakness, he was able to alert his wife that he had another episode, no confusion. He denies any staring/unresponsive episodes,  olfactory/gustatory hallucinations, deja vu, rising epigastric sensation, focal numbness/tingling/weakness, myoclonic jerks. He denies any headaches, dizziness, diplopia, dysarthria/dysphagia, significant neck/back pain, bladder dysfunction. Memory is good. He denies any palpitations although a couple of times a week his Apple watch alerts him his heart rate is over 100 even when he is sitting. He has always had stomach issues even since college, generally occurring after eating. No family history of similar symptoms. He had a normal birth and early development.  There is no history of febrile convulsions, CNS infections such as meningitis/encephalitis, significant traumatic brain injury, neurosurgical procedures, or family history of seizures.   PAST MEDICAL HISTORY: Past Medical History:  Diagnosis Date   Chest pain    Hypertension    Inguinal hernia     PAST SURGICAL HISTORY: Past Surgical History:  Procedure Laterality Date   HERNIA REPAIR Left    pyloric stenosis  1977   TONSILLECTOMY  1969    MEDICATIONS: Current Outpatient Medications on File Prior to Visit  Medication Sig Dispense Refill   lisinopril (ZESTRIL) 10 MG tablet Take 10 mg by mouth daily.     No current facility-administered medications on file prior to visit.    ALLERGIES: Allergies  Allergen Reactions   Doxycycline Other (See Comments)    Seizures/fainted    FAMILY HISTORY: Family History  Problem Relation Age of Onset   Hypertension Father     SOCIAL HISTORY: Social History   Socioeconomic History   Marital status: Married    Spouse name: Not on file   Number of children: 2   Years of education: Not on file  Highest education level: Not on file  Occupational History   Not on file  Tobacco Use   Smoking status: Never   Smokeless tobacco: Never  Vaping Use   Vaping Use: Never used  Substance and Sexual Activity   Alcohol use: Yes    Comment: occasionally   Drug use: No   Sexual  activity: Not on file  Other Topics Concern   Not on file  Social History Narrative   Right handed    Social Determinants of Health   Financial Resource Strain: Not on file  Food Insecurity: Not on file  Transportation Needs: Not on file  Physical Activity: Not on file  Stress: Not on file  Social Connections: Not on file  Intimate Partner Violence: Not on file     PHYSICAL EXAM: Vitals:   04/12/21 0846  BP: (!) 135/93  Pulse: (!) 105  SpO2: 96%   Vitals:   04/12/21 0846 04/12/21 0911 04/12/21 0912 04/12/21 0913  BP: (!) 135/93 Supine 130/88 Sitting 122/94 Standing 118/88  Pulse: (!) 105 102 108 107  Height: 5\' 7"  (1.702 m)     Weight: 189 lb (85.7 kg)     SpO2: 96% 94% 98% 98%  BMI (Calculated): 29.59       General: No acute distress Head:  Normocephalic/atraumatic Skin/Extremities: No rash, no edema Neurological Exam: Mental status: alert and oriented to person, place, and time, no dysarthria or aphasia, Fund of knowledge is appropriate.  Recent and remote memory are intact, 3/3 delayed recall.  Attention and concentration are normal, 5/5 WORLD backwards.  Cranial nerves: CN I: not tested CN II: pupils equal, round and reactive to light, visual fields intact CN III, IV, VI:  full range of motion, no nystagmus, no ptosis CN V: facial sensation intact CN VII: upper and lower face symmetric CN VIII: hearing intact to conversation Bulk & Tone: normal, no fasciculations. Motor: 5/5 throughout with no pronator drift. Sensation: intact to light touch, cold, pin, vibration sense.  No extinction to double simultaneous stimulation.  Romberg test negative Deep Tendon Reflexes: +2 throughout, no ankle clonus Plantar responses: downgoing bilaterally Cerebellar: no incoordination on finger to nose testing Gait: narrow-based and steady, able to tandem walk adequately. Tremor: none   IMPRESSION: This is a very pleasant 55 year old right-handed man with a history of  hypertension presenting for evaluation of recurrent syncope consistent with vasovagal syncope.His neurological exam is normal. Of note, he did have orthostatic hypotension with change 20-point drop from standing to supine (130/88 to 118/88) but was asymptomatic. Diagnosis discussed with patient. Advised counterpressure maneuvers to perform when symptomatic, trying to get himself to a supine position as much as possible, and increasing hydration. He will follow-up as needed and knows to call for any changes.   Thank you for allowing me to participate in the care of this patient. Please do not hesitate to call for any questions or concerns.   Ellouise Newer, M.D.  CC: Brian. Orland Shields

## 2023-01-15 ENCOUNTER — Encounter (INDEPENDENT_AMBULATORY_CARE_PROVIDER_SITE_OTHER): Payer: Self-pay | Admitting: Otolaryngology

## 2023-01-15 ENCOUNTER — Ambulatory Visit (INDEPENDENT_AMBULATORY_CARE_PROVIDER_SITE_OTHER): Payer: Commercial Managed Care - HMO | Admitting: Otolaryngology

## 2023-01-15 VITALS — BP 162/83 | HR 77 | Ht 67.0 in | Wt 175.0 lb

## 2023-01-15 DIAGNOSIS — J3489 Other specified disorders of nose and nasal sinuses: Secondary | ICD-10-CM

## 2023-01-15 DIAGNOSIS — J320 Chronic maxillary sinusitis: Secondary | ICD-10-CM

## 2023-01-15 DIAGNOSIS — J343 Hypertrophy of nasal turbinates: Secondary | ICD-10-CM

## 2023-01-15 DIAGNOSIS — J342 Deviated nasal septum: Secondary | ICD-10-CM | POA: Diagnosis not present

## 2023-01-15 DIAGNOSIS — J3089 Other allergic rhinitis: Secondary | ICD-10-CM

## 2023-01-15 MED ORDER — FLUTICASONE PROPIONATE 50 MCG/ACT NA SUSP: 2.0000 | Freq: Every day | NASAL | 6 refills | Status: AC

## 2023-01-15 MED ORDER — DESLORATADINE 5 MG PO TABS: 5.0000 mg | ORAL_TABLET | Freq: Every day | ORAL | 3 refills | Status: AC

## 2023-01-15 NOTE — Patient Instructions (Addendum)
-   schedule CT sinuses  - Clarinex and start daily Flonase  - return after scan

## 2023-01-15 NOTE — Progress Notes (Signed)
ENT CONSULT:  Reason for Consult: nasal septal deviation and nasal obstruction   HPI: Brian Shields is an 57 y.o. male with hx of several syncopal episodes (reported to be vasovagal per patient) who is here for left sided nasal obstruction and septal deviation. He was recently training for an endurance exercise competition and noted that it is hard to breath through the left side of his nose. He is not bothered by the way his nose appears externally. He takes OTC allergy medication as needed, mostly in the Spring and Fall. He had allergy sx while in [place] when he was in college, then they improved after he left the state. He has been in [place] since 2010. No hx of sinus infections, and no prior sinus surgeries. No prior allergy testing.   Records Reviewed:  ED visit 11/24/2018 57 year old male with past medical history of hypertension presenting for episode of syncope while on the toilet today.  Patient reports that he has had a diarrhea illness today and just prior to arrival he was having some loose stools when he found himself on the ground.  Patient reports that he was clammy at the time.  Patient did not know if he was straining or not.  He denies any prodromal chest pain or shortness of breath.  He denies any headache before the syncope.  Denies numbness or tingling. He developed a hematoma on the forehead as a result of the syncope.  He reports that he feels back to baseline now.  He does report that when he had another bowel movement after the episode of syncope he noticed bright red blood in the toilet.  He denies a history of this.  Patient's other episode of syncope 1 year ago occurred in the exact same fashion with abdominal cramping followed by diarrhea.  He was not medically evaluated time.  Patient had a cardiac evaluation 2 years ago after an episode of chest pain and he was cleared by his cardiologist after a unremarkable coronary artery CT and cardiovascular work-up.   Work-up demonstrating  slight elevation WBC count to 12.4.  He has a hemoglobin of 17.  CMP unremarkable.  Troponin negative.  EKG in normal sinus rhythm without evidence of arrhythmia, infarction, or ischemia, reviewed by me.  Patient CBC was repeated at 3 hours given patient's report of ongoing bloody stools.  It went from 17-16.  CT head and CT maxillofacial without any fracture or intracranial abnormality.  Patient CT abdomen and pelvis demonstrating some areas of some inflammation in the jejunum as well as sigmoid colon.  No diverticulitis.  Appears to be enterocolitis either infectious or inflammatory in etiology.  Patient had a bloody stool here in the emergency department, and a GI panel was sent.   Discussed with the patient that we will not initiate antibiotics without the GI panel.  Should this result with a non-Shiga toxin bacteria in the stool, would recommend likely Cipro.  Patient is also given Bentyl for his cramping.     Past Medical History:  Diagnosis Date   Chest pain    Hypertension    Inguinal hernia     Past Surgical History:  Procedure Laterality Date   HERNIA REPAIR Left    pyloric stenosis  1977   TONSILLECTOMY  1969    Family History  Problem Relation Age of Onset   Hypertension Father     Social History:  reports that he has never smoked. He has never used smokeless tobacco. He reports current alcohol  use. He reports that he does not use drugs.  Allergies:  Allergies  Allergen Reactions   Doxycycline Other (See Comments)    Seizures/fainted    Medications: I have reviewed the patient's current medications.  The PMH, PSH, Medications, Allergies, and SH were reviewed and updated.  ROS: Constitutional: Negative for fever, weight loss and weight gain. Cardiovascular: Negative for chest pain and dyspnea on exertion. Respiratory: Is not experiencing shortness of breath at rest. Gastrointestinal: Negative for nausea and vomiting. Neurological: Negative for  headaches. Psychiatric: The patient is not nervous/anxious  Blood pressure (!) 162/83, pulse 77, height 5\' 7"  (1.702 m), weight 175 lb (79.4 kg), SpO2 96%.  PHYSICAL EXAM:  Exam: General: Well-developed, well-nourished Respiratory Respiratory effort: Equal inspiration and expiration without stridor Cardiovascular Peripheral Vascular: Warm extremities with equal color/perfusion Eyes: No nystagmus with equal extraocular motion bilaterally Neuro/Psych/Balance: Patient oriented to person, place, and time; Appropriate mood and affect; Gait is intact with no imbalance; Cranial nerves I-XII are intact Head and Face Inspection: Normocephalic and atraumatic without mass or lesion Palpation: Facial skeleton intact without bony stepoffs Salivary Glands: No mass or tenderness Facial Strength: Facial motility symmetric and full bilaterally ENT Pinna: External ear intact and fully developed External canal: Canal is patent with intact skin Tympanic Membrane: Clear and mobile External Nose: No scar or anatomic deformity Internal Nose: Septum is deviated to the left - see procedure note  Lips, Teeth, and gums: Mucosa and teeth intact and viable TMJ: No pain to palpation with full mobility Oral cavity/oropharynx: No erythema or exudate, no lesions present Nasopharynx: No mass or lesion with intact mucosa Neck Neck and Trachea: Midline trachea without mass or lesion Thyroid: No mass or nodularity Lymphatics: No lymphadenopathy  Procedure:   PROCEDURE NOTE: nasal endoscopy  Preoperative diagnosis: chronic sinusitis symptoms and nasal obstruction on the left   Postoperative diagnosis: same  Procedure: Diagnostic nasal endoscopy (19147)  Surgeon: Ashok Croon, M.D.  Anesthesia: Topical lidocaine and Afrin  H&P REVIEW: The patient's history and physical were reviewed today prior to procedure. All medications were reviewed and updated as well. Complications: None Condition is stable  throughout exam Indications and consent: The patient presents with symptoms of chronic sinusitis not responding to previous therapies. All the risks, benefits, and potential complications were reviewed with the patient preoperatively and informed consent was obtained. The time out was completed with confirmation of the correct procedure.   Procedure: The patient was seated upright in the clinic. Topical lidocaine and Afrin were applied to the nasal cavity. After adequate anesthesia had occurred, the rigid nasal endoscope was passed into the nasal cavity. The nasal mucosa, turbinates, septum, and sinus drainage pathways were visualized bilaterally. This revealed no purulence or significant secretions that might be cultured. There were no polyps or sites of significant inflammation. The mucosa was intact and there was no crusting present.  Evidence of significant left-sided septal deviation with mid septal spur and peeling lateral nasal wall and causing 90% obstruction of the nasal passage on the left side . the scope was then slowly withdrawn and the patient tolerated the procedure well. There were no complications or blood loss.       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  CLINICAL DATA:  Syncopal episode hit face on shower and floor with left facial swelling and bruising   EXAM: CT HEAD WITHOUT CONTRAST   CT MAXILLOFACIAL WITHOUT CONTRAST   TECHNIQUE: Multidetector CT imaging of the head and maxillofacial structures were performed using the standard protocol without intravenous contrast.  Multiplanar CT image reconstructions of the maxillofacial structures were also generated.   COMPARISON:  None.   FINDINGS: CT HEAD FINDINGS   Brain: No evidence of acute infarction, hemorrhage, hydrocephalus, extra-axial collection or mass lesion/mass effect.   Vascular: No hyperdense vessel or unexpected calcification.   Skull: Normal. Negative for fracture or focal lesion.   Other: Large left forehead and periorbital soft tissue swelling   CT MAXILLOFACIAL FINDINGS   Osseous: Mandibular heads are normally position. No mandibular fracture. Pterygoid plates and zygomatic arches are intact. No acute nasal bone fracture.   Orbits: Negative. No traumatic or inflammatory finding.   Sinuses: No acute fluid level. No sinus wall fracture. Patchy mucosal thickening in the left ethmoid and frontal sinus. Hypoplastic left maxillary sinus which is opacified.   Soft tissues: Moderate to large left periorbital and forehead hematoma   IMPRESSION: 1. Negative non contrasted CT appearance of the brain. 2. No acute facial bone fracture 3. Moderate to large left forehead and periorbital soft tissue hematoma      Assessment/Plan: Encounter Diagnoses  Name Primary?   Chronic maxillary sinusitis Yes   Nasal septal deviation    Hypertrophy of inferior nasal turbinate    Nasal obstruction    Environmental and seasonal allergies    57 year old male with history of recurrent syncopal episodes that were previously deemed to be due to vasovagal episodes, here for chronic nasal obstruction worse on the left side, in the setting of known longstanding history of allergy symptoms.  No recent dedicated CT sinuses.  Denies prior episodes of sinus infections that required antibiotics.  He feels that since he moved to [place] in 2010 his allergy symptoms are milder, and takes antihistamine as needed mostly and spring and fall seasons.  He has chronic nasal obstruction on the left side became  more apparent recently when she started training for an endurance exercise competition, with close to 0 sensation of airflow on the left side according to the report.  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   My exam including bilateral nasal endoscopy showed evidence of significant left-sided septal deviation with mid septal spur and peeling lateral nasal wall and causing 90% obstruction of the nasal passage on the left side   Will obtain sinus CT to rule out persistent chronic maxillary sinusitis on the left side, and initiate medical management of nasal congestion and nasal obstruction with daily antihistamine and Flonase.  He has been doing over-the-counter antihistamine for over a year with lack of response for reports.  Will consider nasal steroid rinses and surgical interventions if he fails maximal medical management.  Will consider sinus surgery such as balloon sinuplasty on the left side if he  has persistent chronic sinus disease on pending CT of the sinuses.  Will consider referral for allergy testing and allergy shots if needed in the future.  - schedule CT sinuses  - Clarinex and start daily Flonase  - return after scan   Thank you for allowing me to participate in the care of this patient. Please do not hesitate to contact me with any questions or concerns.   Ashok Croon, MD Otolaryngology [facility] Phone: [phone] Fax: [phone]    01/15/2023, 2:52 PM

## 2023-01-24 ENCOUNTER — Ambulatory Visit (HOSPITAL_COMMUNITY)
Admission: RE | Admit: 2023-01-24 | Discharge: 2023-01-24 | Disposition: A | Discharge status: Home / Self Care | Payer: Commercial Managed Care - HMO | Source: Ambulatory Visit | Attending: Otolaryngology | Admitting: Otolaryngology

## 2023-01-24 DIAGNOSIS — J320 Chronic maxillary sinusitis: Secondary | ICD-10-CM | POA: Insufficient documentation

## 2023-01-25 IMAGING — CR DG RIBS W/ CHEST 3+V*L*
3 series · 3 of 3 positions shown · non-contrast
Comparison: 12/20/2017

CLINICAL DATA: Syncopal episode 4 days ago now with left-sided rib
pain.

EXAM:
LEFT RIBS AND CHEST - 3+ VIEW

[w chest pa]
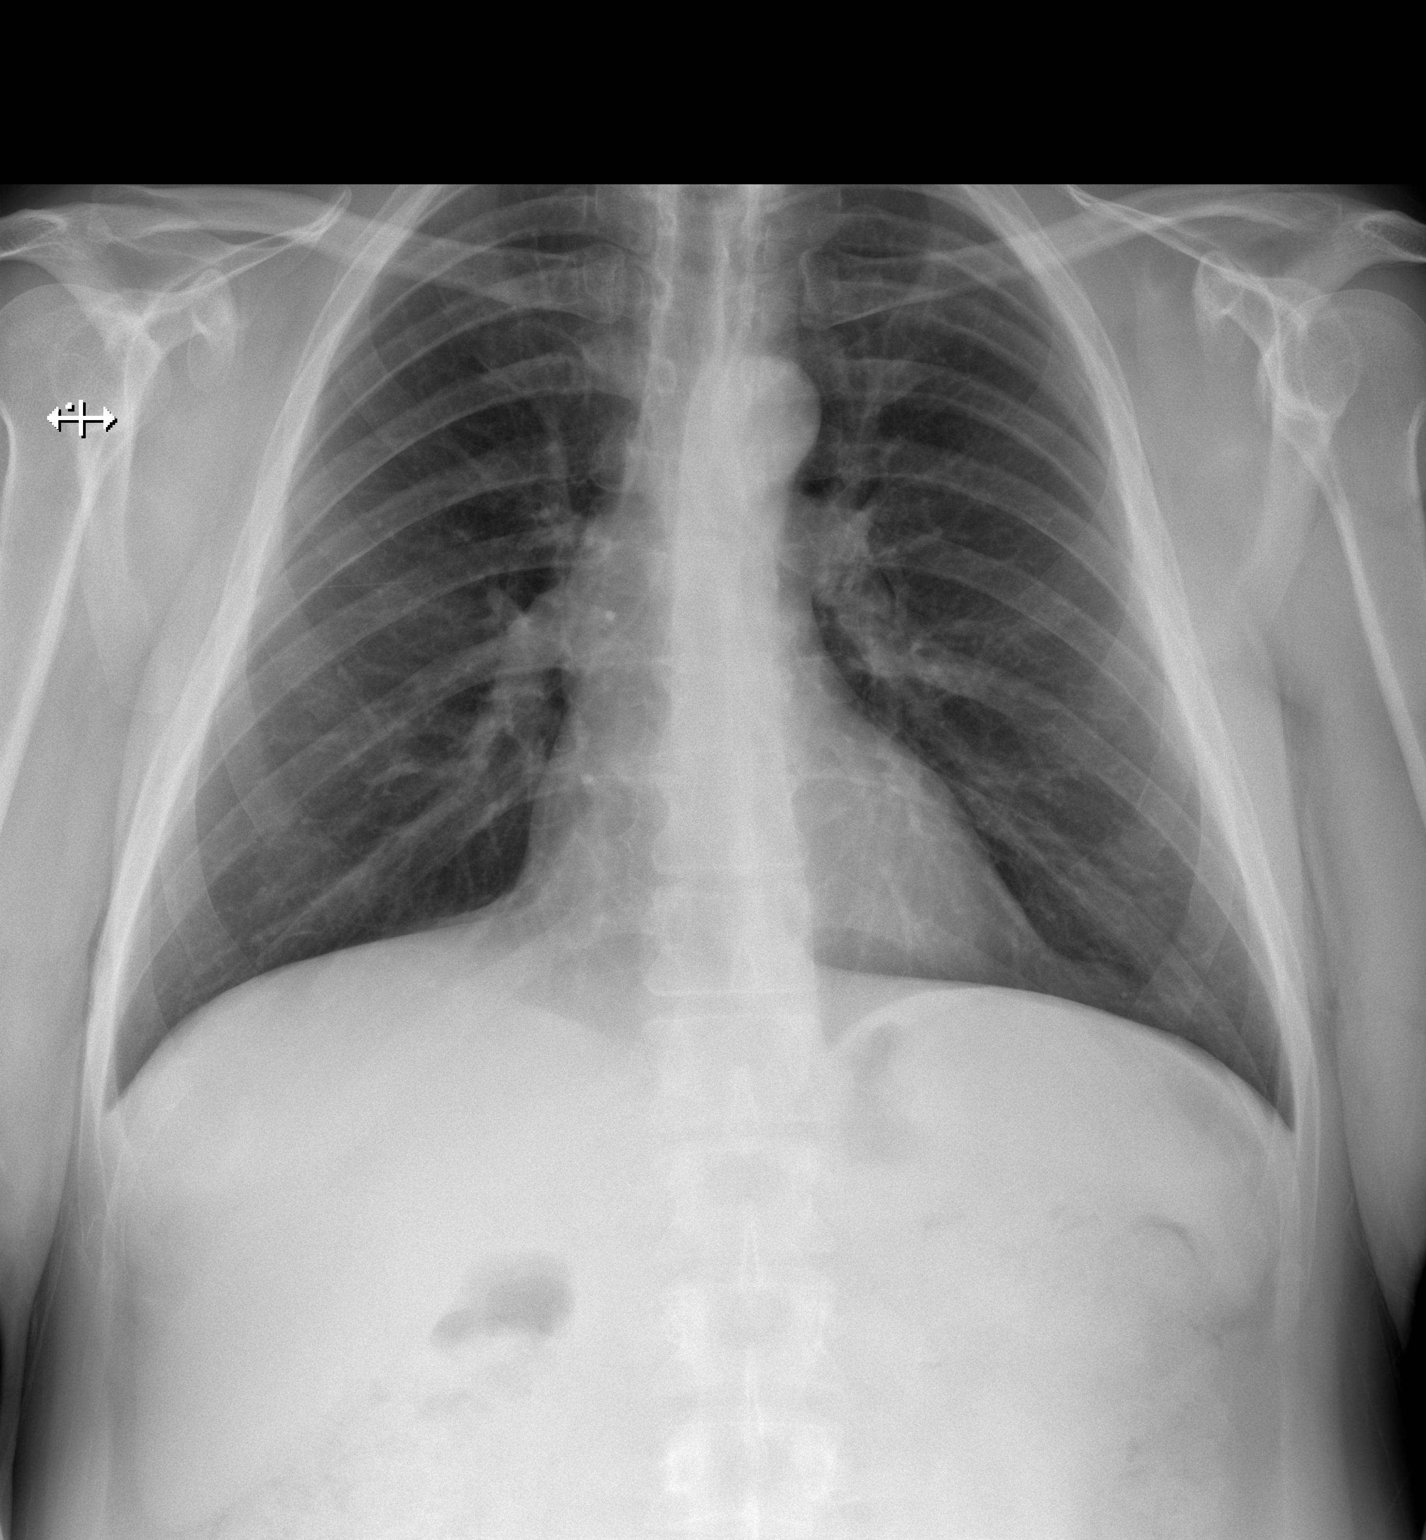

[w ribs ap upper left]
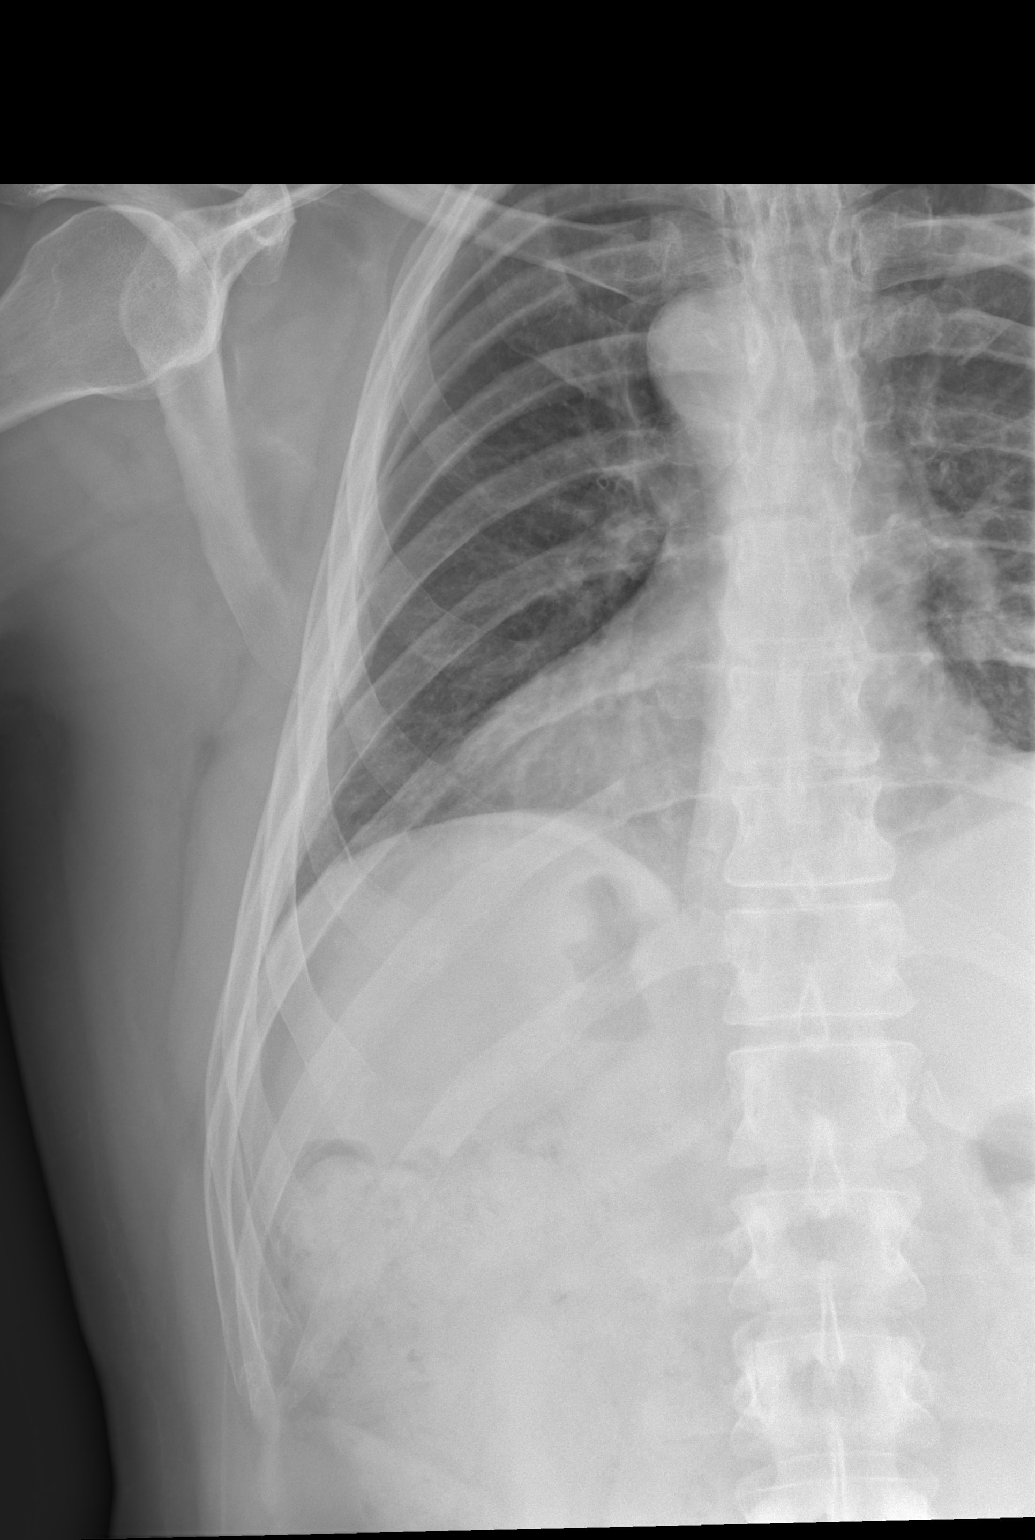

[w ribs obl left]
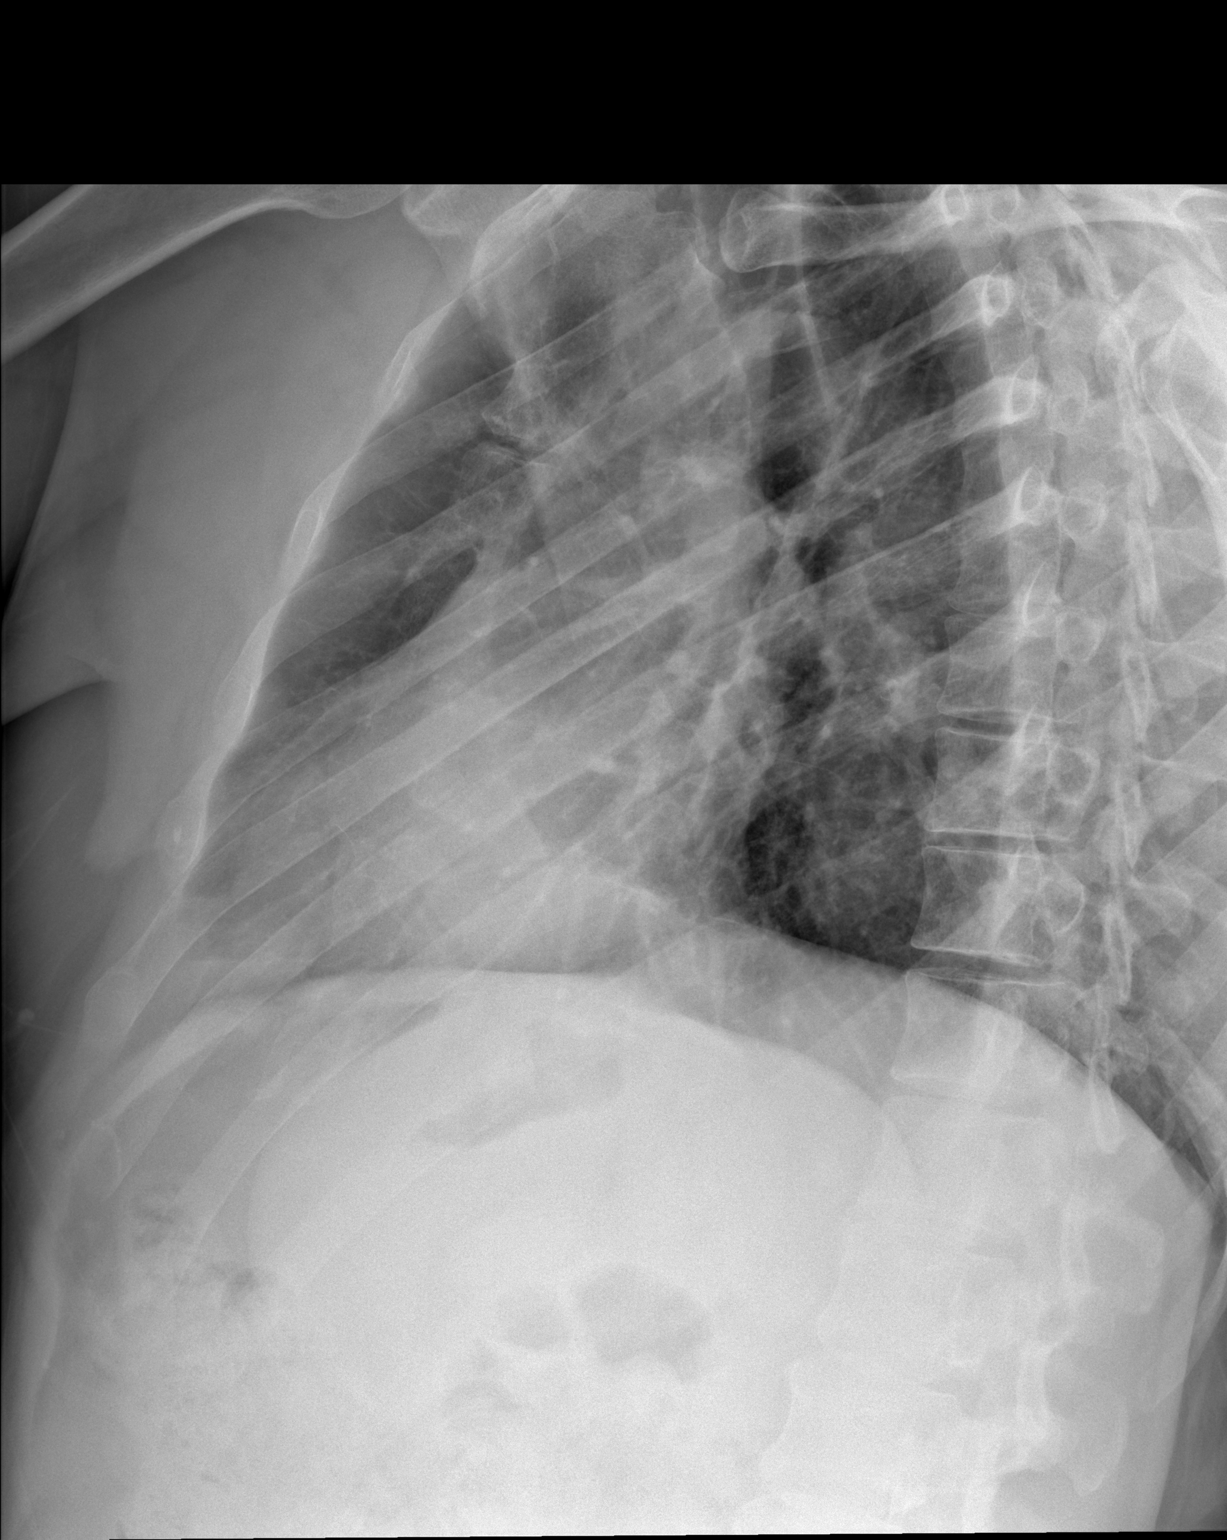

[3 of 3 positions shown; findings below may reference images not displayed]

FINDINGS: Grossly unchanged cardiac silhouette and mediastinal contours. No
focal airspace opacities. No pleural effusion or pneumothorax. No
evidence of edema.

No definite displaced left-sided rib fractures. Regional soft
tissues appear normal. No radiopaque foreign body.
IMPRESSION: 1.  No acute cardiopulmonary disease.
2. No definite displaced left-sided rib fractures.

## 2023-02-26 ENCOUNTER — Ambulatory Visit (INDEPENDENT_AMBULATORY_CARE_PROVIDER_SITE_OTHER): Payer: Commercial Managed Care - HMO | Admitting: Otolaryngology

## 2023-02-26 ENCOUNTER — Encounter (INDEPENDENT_AMBULATORY_CARE_PROVIDER_SITE_OTHER): Payer: Self-pay | Admitting: Otolaryngology

## 2023-02-26 VITALS — BP 133/91 | HR 80 | Ht 67.0 in | Wt 178.0 lb

## 2023-02-26 DIAGNOSIS — J3489 Other specified disorders of nose and nasal sinuses: Secondary | ICD-10-CM | POA: Diagnosis not present

## 2023-02-26 DIAGNOSIS — J320 Chronic maxillary sinusitis: Secondary | ICD-10-CM | POA: Diagnosis not present

## 2023-02-26 DIAGNOSIS — J342 Deviated nasal septum: Secondary | ICD-10-CM

## 2023-02-26 DIAGNOSIS — J343 Hypertrophy of nasal turbinates: Secondary | ICD-10-CM

## 2023-02-26 DIAGNOSIS — J3089 Other allergic rhinitis: Secondary | ICD-10-CM

## 2023-02-26 NOTE — Progress Notes (Signed)
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  He states that he started Flonase and Clarinex, symptoms of nasal blockage and obstruction are somewhat better, but he continues to have trouble breathing through his nose.   Initial HPI from 01/15/2023  Reason for Consult: nasal septal deviation and nasal obstruction   HPI: Brian Shields is an 57 y.o. male with hx of several syncopal episodes (reported to be vasovagal per patient) who is here for left sided nasal obstruction and septal deviation. He was recently training for an endurance exercise competition and noted that it is hard to breath through the left side of his nose. He is not bothered by the way his nose appears externally. He takes OTC allergy medication as needed, mostly in the Spring and Fall. He had allergy sx while in [place] when he was in college, then they improved after he left the state. He has been in [place] since 2010. No hx of sinus infections, and no prior sinus surgeries. No prior allergy testing.   Records Reviewed:  ED visit 11/24/2018 57 year old male with past medical history of hypertension presenting for episode of syncope while on the toilet today.  Patient reports that he has had a diarrhea illness today and just prior to arrival he was having some loose stools when he found himself on the ground.  Patient reports that he was clammy at the time.  Patient did not know if he was straining or not.  He denies any prodromal chest pain or shortness of breath.  He denies any headache before the syncope.  Denies numbness or tingling. He developed a hematoma on the forehead as a result of the syncope.  He reports that he feels back to baseline now.  He does report that when he had another bowel movement after the episode of syncope he noticed bright red blood in the  toilet.  He denies a history of this.  Patient's other episode of syncope 1 year ago occurred in the exact same fashion with abdominal cramping followed by diarrhea.  He was not medically evaluated time.  Patient had a cardiac evaluation 2 years ago after an episode of chest pain and he was cleared by his cardiologist after a unremarkable coronary artery CT and cardiovascular work-up.   Work-up demonstrating slight elevation WBC count to 12.4.  He has a hemoglobin of 17.  CMP unremarkable.  Troponin negative.  EKG in normal sinus rhythm without evidence of arrhythmia, infarction, or ischemia, reviewed by me.  Patient CBC was repeated at 3 hours given patient's report of ongoing bloody stools.  It went from 17-16.  CT head and CT maxillofacial without any fracture or intracranial abnormality.  Patient CT abdomen and pelvis demonstrating some areas of some inflammation in the jejunum as well as sigmoid colon.  No diverticulitis.  Appears to be enterocolitis either infectious or inflammatory in etiology.  Patient had a bloody stool here in the emergency department, and a GI panel was sent.   Discussed with the patient that we will not initiate antibiotics without the GI panel.  Should this result with a non-Shiga toxin bacteria in the stool, would recommend likely Cipro.  Patient is also given Bentyl for his cramping.     Past Medical History:  Diagnosis Date   Chest pain    Hypertension    Inguinal hernia  Past Surgical History:  Procedure Laterality Date   HERNIA REPAIR Left    pyloric stenosis  1977   TONSILLECTOMY  1969    Family History  Problem Relation Age of Onset   Hypertension Father     Social History:  reports that he has never smoked. He has never used smokeless tobacco. He reports current alcohol use. He reports that he does not use drugs.  Allergies:  Allergies  Allergen Reactions   Doxycycline Other (See Comments)    Seizures/fainted    Medications: I have reviewed  the patient's current medications.  The PMH, PSH, Medications, Allergies, and SH were reviewed and updated.  ROS: Constitutional: Negative for fever, weight loss and weight gain. Cardiovascular: Negative for chest pain and dyspnea on exertion. Respiratory: Is not experiencing shortness of breath at rest. Gastrointestinal: Negative for nausea and vomiting. Neurological: Negative for headaches. Psychiatric: The patient is not nervous/anxious  Blood pressure (!) 133/91, pulse 80, height 5\' 7"  (1.702 m), weight 178 lb (80.7 kg), SpO2 95%.  PHYSICAL EXAM:  Exam: General: Well-developed, well-nourished Respiratory Respiratory effort: Equal inspiration and expiration without stridor Cardiovascular Peripheral Vascular: Warm extremities with equal color/perfusion Appropriate mood and affect; Gait is intact with no imbalance; Cranial nerves I-XII are intact Head and Face Inspection: Normocephalic and atraumatic without mass or lesion Facial Strength: Facial motility symmetric and full bilaterally ENT Pinna: External ear intact and fully developed External Nose: No scar evidence of nasal tip deviation to the right (+) alar collapse with inspiration and (+) Cottle maneuver   Procedure note from 01/15/23:   PROCEDURE NOTE: nasal endoscopy  Preoperative diagnosis: chronic sinusitis symptoms and nasal obstruction on the left   Postoperative diagnosis: same  Procedure: Diagnostic nasal endoscopy (29562)  Surgeon: Ashok Croon, M.D.  Anesthesia: Topical lidocaine and Afrin  H&P REVIEW: The patient's history and physical were reviewed today prior to procedure. All medications were reviewed and updated as well. Complications: None Condition is stable throughout exam Indications and consent: The patient presents with symptoms of chronic sinusitis not responding to previous therapies. All the risks, benefits, and potential complications were reviewed with the patient preoperatively and  informed consent was obtained. The time out was completed with confirmation of the correct procedure.   Procedure: The patient was seated upright in the clinic. Topical lidocaine and Afrin were applied to the nasal cavity. After adequate anesthesia had occurred, the rigid nasal endoscope was passed into the nasal cavity. The nasal mucosa, turbinates, septum, and sinus drainage pathways were visualized bilaterally. This revealed no purulence or significant secretions that might be cultured. There were no polyps or sites of significant inflammation. The mucosa was intact and there was no crusting present.  Evidence of significant left-sided septal deviation with mid septal spur and peeling lateral nasal wall and causing 90% obstruction of the nasal passage on the left side . the scope was then slowly withdrawn and the patient tolerated the procedure well. There were no complications or blood loss.       Studies Reviewed: I personally reviewed images for CT max face done on 11/24/18 -evidence of left-sided hypoplastic maxillary sinus completely obliterated with secretions and significant septal deviation to the left side as well as narrowing of the left nasal passage on the scan, evidence of bilateral inferior turban hypertrophy, minimal thickening of mucosal along the left ethmoid sinuses, the rest of the paranasal sinuses appear clear.  CLINICAL DATA:  Syncopal episode hit face on shower and floor with left facial swelling  and bruising   EXAM: CT HEAD WITHOUT CONTRAST   CT MAXILLOFACIAL WITHOUT CONTRAST   TECHNIQUE: Multidetector CT imaging of the head and maxillofacial structures were performed using the standard protocol without intravenous contrast. Multiplanar CT image reconstructions of the maxillofacial structures were also generated.   COMPARISON:  None.   FINDINGS: CT HEAD FINDINGS   Brain: No evidence of acute infarction, hemorrhage, hydrocephalus, extra-axial collection or mass  lesion/mass effect.   Vascular: No hyperdense vessel or unexpected calcification.   Skull: Normal. Negative for fracture or focal lesion.   Other: Large left forehead and periorbital soft tissue swelling   CT MAXILLOFACIAL FINDINGS   Osseous: Mandibular heads are normally position. No mandibular fracture. Pterygoid plates and zygomatic arches are intact. No acute nasal bone fracture.   Orbits: Negative. No traumatic or inflammatory finding.   Sinuses: No acute fluid level. No sinus wall fracture. Patchy mucosal thickening in the left ethmoid and frontal sinus. Hypoplastic left maxillary sinus which is opacified.   Soft tissues: Moderate to large left periorbital and forehead hematoma   IMPRESSION: 1. Negative non contrasted CT appearance of the brain. 2. No acute facial bone fracture 3. Moderate to large left forehead and periorbital soft tissue Hematoma  CT sinuses 01/24/23 -personally reviewed with the patient today and with evidence of persistent chronic sinus inflammation and left maxillary/frontal ethmoid sinuses and significant septal deviation with narrowing of left nasal passage   COMPARISON:  CT head and maxillofacial 11/24/2018   FINDINGS: Paranasal sinuses:   Frontal: Unchanged complete opacification of the left frontal sinus. Hypoplastic, clear right frontal sinus.   Ethmoid: Unchanged left anterior and mid ethmoid air cell mucosal thickening/opacification and volume loss. Clear right ethmoid air cells.   Maxillary: Unchanged complete opacification of the left maxillary sinus with associated volume loss. Clear right maxillary sinus.   Sphenoid: Normally aerated. Patent sphenoethmoidal recesses.   Right ostiomeatal unit: Patent.   Left ostiomeatal unit: Opacification of the maxillary sinus ostium and infundibulum.   Nasal passages: 8 mm leftward deviation of an intact nasal septum with leftward septal spurring and mucosal thickening resulting  in narrowing of the left nasal cavity.   Anatomy: No pneumatization superior to anterior ethmoid notches. Keros II. Sellar sphenoid pneumatization pattern. No dehiscence of carotid or optic canals. No onodi cell.   Other: Clear mastoid air cells and middle ear cavities. Unremarkable appearance of the orbits and included portion of the brain.   IMPRESSION: 1. Chronic left ostiomeatal unit obstruction with unchanged opacification of the left frontal sinus, maxillary sinus, and anterior and mid ethmoid air cells. 2. Leftward nasal septal deviation.      Assessment/Plan: Encounter Diagnoses  Name Primary?   Chronic maxillary sinusitis Yes   Nasal septal deviation    Hypertrophy of inferior nasal turbinate    Nasal obstruction    Environmental and seasonal allergies     57 year old male with history of recurrent syncopal episodes that were previously deemed to be due to vasovagal episodes, here for chronic nasal obstruction worse on the left side, in the setting of known longstanding history of allergy symptoms.  No recent dedicated CT sinuses.  Denies prior episodes of sinus infections that required antibiotics.  He feels that since he moved to [place] in 2010 his allergy symptoms are milder, and takes antihistamine as needed mostly and spring and fall seasons.  He has chronic nasal obstruction on the left side became more apparent recently when she started training for an endurance exercise competition,  with close to 0 sensation of airflow on the left side according to the report.  I did review his max face CT done in 2020, with findings outlined below:  I personally reviewed images for CT max face done on 11/24/18 -evidence of left-sided hypoplastic maxillary sinus completely obliterated with secretions and significant septal deviation to the left side as well as narrowing of the left nasal passage on the scan, evidence of bilateral inferior turban hypertrophy, minimal thickening  of mucosal along the left ethmoid sinuses, the rest of the paranasal sinuses appear clear.   My exam including bilateral nasal endoscopy showed evidence of significant left-sided septal deviation with mid septal spur and peeling lateral nasal wall and causing 90% obstruction of the nasal passage on the left side   Will obtain sinus CT to rule out persistent chronic maxillary sinusitis on the left side, and initiate medical management of nasal congestion and nasal obstruction with daily antihistamine and Flonase.  He has been doing over-the-counter antihistamine for over a year with lack of response for reports.  Will consider nasal steroid rinses and surgical interventions if he fails maximal medical management.  Will consider sinus surgery such as balloon sinuplasty on the left side if he has persistent chronic sinus disease on pending CT of the sinuses.  Will consider referral for allergy testing and allergy shots if needed in the future.  - schedule CT sinuses  - Clarinex and start daily Flonase  - return after scan   Update 02/26/23 He had CT sinuses which I personally reviewed and it revealed left hypoplastic maxillary sinus, persistent left maxillary/ethmoid and frontal sinusitis and severe septal deviation to the left with significant narrowing of the left nasal passage.  He started daily Clarinex and Flonase nasal spray.  He feels that it is slightly easier to breathe through his left nasal passage since the initiation of medical management of nasal congestion, but continues to have trouble with nasal obstruction.  This affects him the most when he is exercising  I reviewed CT findings with the patient including severe nasal blockage on the left side and evidence of persistent complete opacification of left maxillary/frontal ethmoid sinuses.   Nasal endoscopy during his last visit showed the following:  Evidence of significant left-sided septal deviation with mid septal spur and peeling lateral  nasal wall and causing 90% obstruction of the nasal passage on the left side .  I reviewed imaging findings with the patient, and discussed that in order to correct anatomic obstruction he would require septoplasty and inferior turbinate reduction.  Additionally he had evidence of persistent left maxillary/frontal ethmoid sinusitis on repeat CT scan compared to the one he had in June 2024. He also has narrowing of the internal and external nasal valve bilaterally and evidence of (+) Cottle maneuver on exam.   Nasal obstruction, septal deviation, chronic left maxillary/frontal/ethmoid sinusitis on repeat scan -We discussed medical management of his symptoms including oral antihistamine and Flonase -We discussed surgical interventions to relieve nasal obstruction and help clear chronic sinus disease on the left side which would only require left maxillary antrostomy versus balloon sinuplasty, will also consider                                                                                                                                                                                                                                                 -  I discussed the risks and benefits of septoplasty ITR and left maxillary antrostomy with lavage with the patient, and he would like to think about surgery -He would like to know the upfront cost after insurance coverage for this procedure, and we will have to call the patient with an estimate of out-of-pocket cost -I also discussed at length that in order to correct obvious external nasal deformity and nasal tip deviation as well as significant alar collapse with inspiration he may require functional septorhinoplasty, for which I would have to refer him to Dr. Ernestene Kiel, who completed facial plastics fellowship  -He will consider his options and will return to [place] once he hears back from [place] with a quote for the out-of-pocket costs  2. Environmental allergies -  continue Flonase 2 puffs b/l nares BID  - continue Clarinex 5 mg daily  Will obtain an estimate of out-of-pocket cost for surgical interventions and will will contact the patient.  He will return in a few months or sooner if a decision about surgical interventions has been made.    Ashok Croon, MD Otolaryngology [facility] Phone: [phone] Fax: [phone]    02/26/2023, 7:31 PM
